# Patient Record
Sex: Male | Born: 1985 | Race: White | Hispanic: No | Marital: Married | Smoking: Current every day smoker
Health system: Southern US, Community
[De-identification: ages and names within clinical notes are randomized; demographics above are authoritative.]

## PROBLEM LIST (undated history)

## (undated) DIAGNOSIS — M549 Dorsalgia, unspecified: Secondary | ICD-10-CM

## (undated) DIAGNOSIS — F419 Anxiety disorder, unspecified: Secondary | ICD-10-CM

---

## 2007-09-20 ENCOUNTER — Emergency Department (HOSPITAL_COMMUNITY): Admission: EM | Admit: 2007-09-20 | Discharge: 2007-09-20 | Payer: Self-pay | Admitting: Emergency Medicine

## 2007-12-19 ENCOUNTER — Emergency Department (HOSPITAL_COMMUNITY): Admission: EM | Admit: 2007-12-19 | Discharge: 2007-12-19 | Payer: Self-pay | Admitting: *Deleted

## 2009-10-26 ENCOUNTER — Emergency Department (HOSPITAL_COMMUNITY): Admission: EM | Admit: 2009-10-26 | Discharge: 2009-10-26 | Payer: Self-pay | Admitting: Emergency Medicine

## 2009-11-03 ENCOUNTER — Emergency Department (HOSPITAL_COMMUNITY): Admission: EM | Admit: 2009-11-03 | Discharge: 2009-11-04 | Payer: Self-pay | Admitting: Emergency Medicine

## 2011-02-15 LAB — POCT I-STAT, CHEM 8
Chloride: 104
Creatinine, Ser: 1.1
Potassium: 3.4 — ABNORMAL LOW
TCO2: 24

## 2011-12-07 ENCOUNTER — Emergency Department (HOSPITAL_COMMUNITY): Payer: 59

## 2011-12-07 ENCOUNTER — Encounter (HOSPITAL_COMMUNITY): Payer: Self-pay | Admitting: *Deleted

## 2011-12-07 ENCOUNTER — Emergency Department (HOSPITAL_COMMUNITY)
Admission: EM | Admit: 2011-12-07 | Discharge: 2011-12-07 | Disposition: A | Discharge status: Home / Self Care | Payer: 59 | Attending: Emergency Medicine | Admitting: Emergency Medicine

## 2011-12-07 DIAGNOSIS — M549 Dorsalgia, unspecified: Secondary | ICD-10-CM

## 2011-12-07 DIAGNOSIS — F17200 Nicotine dependence, unspecified, uncomplicated: Secondary | ICD-10-CM | POA: Insufficient documentation

## 2011-12-07 DIAGNOSIS — M79609 Pain in unspecified limb: Secondary | ICD-10-CM | POA: Insufficient documentation

## 2011-12-07 HISTORY — DX: Dorsalgia, unspecified: M54.9

## 2011-12-07 MED ORDER — HYDROCODONE-ACETAMINOPHEN 5-325 MG PO TABS: ORAL_TABLET | ORAL | Status: AC

## 2011-12-07 MED ORDER — CYCLOBENZAPRINE HCL 10 MG PO TABS: 10.0000 mg | ORAL_TABLET | Freq: Three times a day (TID) | ORAL | Status: AC | PRN

## 2011-12-07 MED ORDER — PREDNISONE 10 MG PO TABS: ORAL_TABLET | ORAL | Status: DC
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
## 2011-12-07 NOTE — ED Notes (Signed)
Received report agree with previous assessment 
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
## 2011-12-07 NOTE — ED Notes (Signed)
Pt stable at discharge Pt instructed not to drive while on pain medication and muscle relaxers Verbalizes understanding

## 2011-12-16 NOTE — ED Provider Notes (Signed)
Medical screening examination/treatment/procedure(s) were performed by non-physician practitioner and as supervising physician I was immediately available for consultation/collaboration.  Donnetta Hutching, MD 12/16/11 [phone]

## 2012-02-08 ENCOUNTER — Emergency Department (HOSPITAL_COMMUNITY): Payer: 59

## 2012-02-08 ENCOUNTER — Emergency Department (HOSPITAL_COMMUNITY)
Admission: EM | Admit: 2012-02-08 | Discharge: 2012-02-08 | Disposition: A | Discharge status: Home / Self Care | Payer: 59 | Attending: Emergency Medicine | Admitting: Emergency Medicine

## 2012-02-08 ENCOUNTER — Encounter (HOSPITAL_COMMUNITY): Payer: Self-pay | Admitting: *Deleted

## 2012-02-08 DIAGNOSIS — S335XXA Sprain of ligaments of lumbar spine, initial encounter: Secondary | ICD-10-CM | POA: Insufficient documentation

## 2012-02-08 DIAGNOSIS — S39012A Strain of muscle, fascia and tendon of lower back, initial encounter: Secondary | ICD-10-CM

## 2012-02-08 DIAGNOSIS — X500XXA Overexertion from strenuous movement or load, initial encounter: Secondary | ICD-10-CM | POA: Insufficient documentation

## 2012-02-08 DIAGNOSIS — Y92009 Unspecified place in unspecified non-institutional (private) residence as the place of occurrence of the external cause: Secondary | ICD-10-CM | POA: Insufficient documentation

## 2012-02-08 MED ORDER — HYDROCODONE-ACETAMINOPHEN 5-325 MG PO TABS: 1.0000 | ORAL_TABLET | Freq: Four times a day (QID) | ORAL | Status: AC | PRN

## 2012-02-08 MED ORDER — PREDNISONE 20 MG PO TABS
60.0000 mg | ORAL_TABLET | Freq: Once | ORAL | Status: AC
Start: 2012-02-08 — End: 2012-02-08
  Administered 2012-02-08: 60 mg via ORAL
  Filled 2012-02-08: qty 3

## 2012-02-08 MED ORDER — HYDROCODONE-ACETAMINOPHEN 5-325 MG PO TABS
1.0000 | ORAL_TABLET | Freq: Once | ORAL | Status: AC
  Administered 2012-02-08: 1 via ORAL
  Filled 2012-02-08: qty 1

## 2012-02-08 MED ORDER — PREDNISONE 50 MG PO TABS: 50.0000 mg | ORAL_TABLET | Freq: Every day | ORAL | Status: DC
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
## 2012-02-08 NOTE — ED Notes (Signed)
Pt has chronic lower back pain, picked up heavy jack yesterday and pain has increased today.
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
## 2012-02-09 NOTE — ED Provider Notes (Signed)
Medical screening examination/treatment/procedure(s) were performed by non-physician practitioner and as supervising physician I was immediately available for consultation/collaboration.  Zakya Halabi, MD 02/09/12 1644 

## 2012-06-13 ENCOUNTER — Emergency Department (HOSPITAL_COMMUNITY): Payer: 59

## 2012-06-13 ENCOUNTER — Encounter (HOSPITAL_COMMUNITY): Payer: Self-pay | Admitting: Emergency Medicine

## 2012-06-13 ENCOUNTER — Other Ambulatory Visit: Payer: Self-pay

## 2012-06-13 ENCOUNTER — Emergency Department (HOSPITAL_COMMUNITY)
Admission: EM | Admit: 2012-06-13 | Discharge: 2012-06-13 | Disposition: A | Discharge status: Home / Self Care | Payer: 59 | Attending: Emergency Medicine | Admitting: Emergency Medicine

## 2012-06-13 DIAGNOSIS — F17200 Nicotine dependence, unspecified, uncomplicated: Secondary | ICD-10-CM | POA: Insufficient documentation

## 2012-06-13 DIAGNOSIS — T1490XA Injury, unspecified, initial encounter: Secondary | ICD-10-CM

## 2012-06-13 DIAGNOSIS — S20219A Contusion of unspecified front wall of thorax, initial encounter: Secondary | ICD-10-CM

## 2012-06-13 MED ORDER — OXYCODONE-ACETAMINOPHEN 5-325 MG PO TABS
1.0000 | ORAL_TABLET | Freq: Once | ORAL | Status: AC
  Administered 2012-06-13: 1 via ORAL
  Filled 2012-06-13: qty 1

## 2012-06-13 MED ORDER — NAPROXEN 500 MG PO TABS: 500.0000 mg | ORAL_TABLET | Freq: Two times a day (BID) | ORAL | Status: DC

## 2012-06-13 MED ORDER — HYDROCODONE-ACETAMINOPHEN 5-325 MG PO TABS: ORAL_TABLET | ORAL | Status: DC

## 2012-06-13 MED ORDER — METHOCARBAMOL 500 MG PO TABS: ORAL_TABLET | ORAL | Status: DC
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
## 2012-07-02 NOTE — ED Provider Notes (Signed)
Medical screening examination/treatment/procedure(s) were performed by non-physician practitioner and as supervising physician I was immediately available for consultation/collaboration.  Donnetta Hutching, MD 07/02/12 [phone]

## 2013-06-06 ENCOUNTER — Emergency Department (HOSPITAL_COMMUNITY)
Admission: EM | Admit: 2013-06-06 | Discharge: 2013-06-06 | Disposition: A | Discharge status: Home / Self Care | Payer: 59 | Attending: Emergency Medicine | Admitting: Emergency Medicine

## 2013-06-06 ENCOUNTER — Encounter (HOSPITAL_COMMUNITY): Payer: Self-pay | Admitting: Emergency Medicine

## 2013-06-06 DIAGNOSIS — F419 Anxiety disorder, unspecified: Secondary | ICD-10-CM

## 2013-06-06 DIAGNOSIS — F17200 Nicotine dependence, unspecified, uncomplicated: Secondary | ICD-10-CM | POA: Insufficient documentation

## 2013-06-06 DIAGNOSIS — G479 Sleep disorder, unspecified: Secondary | ICD-10-CM | POA: Insufficient documentation

## 2013-06-06 DIAGNOSIS — R450 Nervousness: Secondary | ICD-10-CM | POA: Insufficient documentation

## 2013-06-06 DIAGNOSIS — M549 Dorsalgia, unspecified: Secondary | ICD-10-CM | POA: Insufficient documentation

## 2013-06-06 DIAGNOSIS — F411 Generalized anxiety disorder: Secondary | ICD-10-CM | POA: Insufficient documentation

## 2013-06-06 HISTORY — DX: Anxiety disorder, unspecified: F41.9

## 2013-06-06 MED ORDER — LORAZEPAM 1 MG PO TABS: 1.0000 mg | ORAL_TABLET | Freq: Three times a day (TID) | ORAL | Status: DC | PRN

## 2013-06-06 MED ORDER — LORAZEPAM 1 MG PO TABS
2.0000 mg | ORAL_TABLET | Freq: Once | ORAL | Status: AC
  Administered 2013-06-06: 2 mg via ORAL
  Filled 2013-06-06: qty 2
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
## 2013-06-06 NOTE — ED Notes (Signed)
Pt has hx of panic attacks and anxiety; after separation with spouse pt has had difficulty sleeping, eating and has been "jittery" and unnerved since last week.  Pt recently obtained insurance; however, hasn't made an appointment to see a provider yet.  He is actively seeking help/provider as well as he feels he needs assistance today.

## 2013-06-06 NOTE — ED Notes (Signed)
Pt appears less anxious and states that he feels a lot better.

## 2013-06-06 NOTE — Discharge Instructions (Signed)
Panic Attacks Panic attacks are sudden, short feelings of great fear or discomfort. You may have them for no reason when you are relaxed, when you are uneasy (anxious), or when you are sleeping.  HOME CARE  Take all your medicines as told.  Check with your doctor before starting new medicines.  Keep all doctor visits. GET HELP IF:  You are not able to take your medicines as told.  Your symptoms do not get better.  Your symptoms get worse. GET HELP RIGHT AWAY IF:  Your attacks seem different than your normal attacks.  You have thoughts about hurting yourself or others.  You take panic attack medicine and you have a side effect. MAKE SURE YOU:  Understand these instructions.  Will watch your condition.  Will get help right away if you are not doing well or get worse. Document Released: 06/11/2010 Document Revised: 02/27/2013 Document Reviewed: 12/21/2012 [facility] Patient Information 2014 [place], [place].  Use resource guide below to help you find the behavioral health followup and to help you find a primary care Dr. Return for any new or worse symptoms.   Emergency Department Resource Guide 1) Find a Doctor and Pay Out of Pocket Although you won't have to find out who is covered by your insurance plan, it is a good idea to ask around and get recommendations. You will then need to call the office and see if the doctor you have chosen will accept you as a new patient and what types of options they offer for patients who are self-pay. Some doctors offer discounts or will set up payment plans for their patients who do not have insurance, but you will need to ask so you aren't surprised when you get to your appointment.  2) Contact Your Local Health Department Not all health departments have doctors that can see patients for sick visits, but many do, so it is worth a call to see if yours does. If you don't know where your local health department is, you can check in your phone book.  The CDC also has a tool to help you locate your state's health department, and many state websites also have listings of all of their local health departments.  3) Find a Walk-in Clinic If your illness is not likely to be very severe or complicated, you may want to try a walk in clinic. These are popping up all over the country in pharmacies, drugstores, and shopping centers. They're usually staffed by nurse practitioners or physician assistants that have been trained to treat common illnesses and complaints. They're usually fairly quick and inexpensive. However, if you have serious medical issues or chronic medical problems, these are probably not your best option.  No Primary Care Doctor: - Call Health Connect at  [phone] - they can help you locate a primary care doctor that  accepts your insurance, provides certain services, etc. - Physician Referral Service- [phone]  Chronic Pain Problems: Organization         Address  Phone   Notes  [facility]  [phone] Patients need to be referred by their primary care doctor.   Medication Assistance: Organization         Address  Phone   Notes  [facility] Medication [facility] [address] [place], [place] [address] [phone] --Must be a resident of [facility] -- Must have NO insurance coverage whatsoever (no Medicaid/ Medicare, etc.) -- The pt. MUST have a primary care doctor  that directs their care regularly and follows them in the community   [facility]  [phone]   [employer]  [phone]    Agencies that provide inexpensive medical care: Organization         Address  Phone   Notes  [facility]  [phone]   [facility]    [phone]   [facility] [address] [place], [place] [address] [phone]   [facility] [address] [place] [place] [phone]   Planned Parenthood     [phone]   [facility]    [phone]   [facility]  [address], [place] Phone:  [phone], Fax:  [phone] Hours of Operation:  9 am - 6 pm, M-F.  Also accepts Medicaid/Medicare and self-pay.  [facility]  [address], [place] Phone: [phone], Fax: [phone]. Hours of Operation:  8:30 am - 5:30 pm, M-F.  Also accepts Medicaid and self-pay.  [facility] [place] [address], [place] Phone: [phone]   [facility] [address] Natasha Bence [place], [place] [phone], Ext. [phone] Mondays & Thursdays: 7-9 AM.  First 15 patients are seen on a first come, first serve basis.    Medicaid-accepting [facility] Providers:  Organization         Address  Phone   Notes  [facility] [address], [place] [phone] Also accepts self-pay patients.  [facility] [address] Laurell Josephs [place], [place]  [phone]   [facility] [address], [place] [phone]   [facility] [address], [place] [phone]   Renaye Rakers [address], [place]   [phone] Only accepts [place] patients after they have their name applied to their card.   Self-Pay (no insurance) in [facility]:  Organization         Address  Phone   Notes  Sickle Cell Patients, [facility] [address] [place], [place] [phone]   [facility] [address] [place], [place] [phone]   [facility] [place]  [address], [place] [phone]   [facility]/Dr. Osei-Bonsu  [address], [place] or [address], [place] [phone] Phone number for both [place] and [place] locations is the same.  Urgent Medical and  [facility] [address], [place] [phone]   [facility] [address] [place] or [address] [phone]   [facility] [address] [place] [phone], phone; [phone], fax Sees patients 1st and 3rd Saturday of every month.  Must not qualify for public or private insurance (i.e. Medicaid, Medicare, [place] Health Choice, Veterans' Benefits)  Household income should be no more than 200% of the poverty level The clinic cannot treat you if you are pregnant or think you are pregnant  Sexually transmitted diseases are not treated at the clinic.    Dental Care: Organization         Address  Phone  Notes  [facility] Department of [facility] [address] [place], [place] [phone] Accepts children  up to age 40 who are enrolled in Medicaid or [place] Health Choice; pregnant women with a Medicaid card; and children who have applied for Medicaid or [place] Health Choice, but were declined, whose parents can pay a reduced fee at time of service.  [facility] Department of [facility]  [address], [place] [phone] Accepts children up to age 74 who are enrolled in [place] or [place] Health Choice; pregnant women with a Medicaid card; and children who have applied for Medicaid or [place] Health Choice, but were declined, whose parents can pay a reduced fee at time of service.  [facility]  [address] [place], [place] [phone] Patients are seen by appointment only. Walk-ins are not accepted. [facility] will see patients 23 years of age and older. Monday - Tuesday (8am-5pm) Most Wednesdays (8:30-5pm) $30 per visit, cash only  [facility]  [address], [facility] [phone] Patients are seen by appointment only. Walk-ins are not accepted. [facility] will see patients 5 years of age and  older. One Wednesday Evening (Monthly: Volunteer Based).  $30 per visit, cash only  [employer]  [phone] for adults; Children under age 7, call Graduate Pediatric Dentistry at [phone]. Children aged 77-14, please call [phone] to request a pediatric application.  Dental services are provided in all areas of dental care including fillings, crowns and bridges, complete and partial dentures, implants, gum treatment, root canals, and extractions. Preventive care is also provided. Treatment is provided to both adults and children. Patients are selected via a lottery and there is often a waiting list.   [facility] [address], [place]  [phone] [email or web address]   [facility] [address] [place], [place] [phone], Ext. [phone] Second and Fourth Thursday of each month, opens at 6:30 AM; Clinic ends at 9 AM.  Patients are seen on a first-come first-served basis, and a limited number are seen during each clinic.   [facility]  [address] Ether Griffins [place], [place] [phone]   Eligibility Requirements You must have lived in [place], [place], or [place] counties for at least the last three months.   You cannot be eligible for state or federal sponsored [place], including [employer], [place], or [employer].   You generally cannot be eligible for healthcare insurance through your employer.    How to apply: Eligibility screenings are held every Tuesday and Wednesday afternoon from 1:00 pm until 4:00 pm. You do not need an appointment for the interview!  [facility] [address], [place], [place] [phone]   [facility] Health Department  [phone]   [facility]  [phone]   [facility] Health Department  [phone]    Behavioral Health Resources in the Community: Intensive Outpatient Programs Organization          Address  Phone  Notes  [facility] [address] [place], [place] [phone]   [facility] [address], [place], [place] [phone]   [facility] [address] [place], [place]  [phone]   [facility] [address],  [place], [place] [phone] or [phone]   Substance Abuse Resources Organization         Address  Phone  Notes  [facility]  2311199192   Addiction  [facility]  [phone]   [facility]  [phone]   [facility]  [phone]   Residential & Outpatient Substance Abuse Program  [phone]   Psychological Services Organization         Address  Phone  Notes  [facility]  [phone]- [phone]   [facility]  [phone]- [phone]   [facility] [address], [place] [phone] or [phone]    Mobile Crisis Teams Organization         Address  Phone  Notes  [facility], Mobile Crisis Care Unit  [phone]   [facility]  [address] [place], [place] [phone]   Doristine LocksSharon DeEsch [address] [place] [phone]    Self-Help/Support Groups Organization         Address  Phone             Notes  [facility] [place] - variety of support groups  336- [phone] Call for more information  Narcotics Anonymous (NA), [facility] [address], [place]  2 meetings at this location   Statisticianesidential Treatment Programs Organization         Address  Phone  Notes  [facility] [address],    [place]  [phone]   [facility]  [address], [place] [phone], [place], [place] [phone]   [facility] [address], [place] [phone] Admissions: 8am-3pm M-F  [facility] [address]    [place], [place] [phone]   [facility] [address], [place], [place] [phone]   [facility] [address]  [place], [place] [phone]   [facility] - Intensive Outpatient [address], [place], [place] [phone]   [facility] ([facility] [address]  [place], [place] [phone] or [phone]   [facility] ([facility]) [address] [place], [place] [phone] Accepts Medicaid  [facility] [address]  [place] [phone] Substance Abuse/Addiction Treatment   [facility] Resources Organization         Address  Phone  Notes  [facility]  [phone]   Angie FavaJulie Brannon, PhD [address] [place], [place]   [phone]) [phone] or [phone]   [facility]   [address] [place], [place] [phone](336) [phone]   [facility] [address], [place], [place] [phone]) [phone] Insurance/Medicaid/sponsorship through [facility]  [facility] [address]                                    [place], [place] [phone] Therapy/tele-psych/case  [facility] [address]   [place], [place] [phone]    Dr. Lolly MustacheArfeen  [phone]   [facility] [place]  [employer] [facility] 1) [address], [place] 2) [address], [place] 3)  [address], [place] [phone](336) [phone]  [phone]   [facility] Child Abuse Hotline [phone](336) [phone] or [phone](336) [phone] (After Hours)

## 2013-06-06 NOTE — ED Notes (Signed)
Pt reports having hx of anxiety, does not take meds for it. Is going through a divorce and high stress level, increase in anxiety. Denies any SI or HI.

## 2013-06-22 ENCOUNTER — Encounter (HOSPITAL_COMMUNITY): Payer: Self-pay | Admitting: Emergency Medicine

## 2013-06-22 ENCOUNTER — Emergency Department (HOSPITAL_COMMUNITY)
Admission: EM | Admit: 2013-06-22 | Discharge: 2013-06-22 | Disposition: A | Discharge status: Home / Self Care | Payer: 59 | Attending: Emergency Medicine | Admitting: Emergency Medicine

## 2013-06-22 DIAGNOSIS — Z79899 Other long term (current) drug therapy: Secondary | ICD-10-CM | POA: Insufficient documentation

## 2013-06-22 DIAGNOSIS — F17200 Nicotine dependence, unspecified, uncomplicated: Secondary | ICD-10-CM | POA: Insufficient documentation

## 2013-06-22 DIAGNOSIS — F1010 Alcohol abuse, uncomplicated: Secondary | ICD-10-CM | POA: Insufficient documentation

## 2013-06-22 DIAGNOSIS — F411 Generalized anxiety disorder: Secondary | ICD-10-CM | POA: Insufficient documentation

## 2013-06-22 DIAGNOSIS — Z789 Other specified health status: Secondary | ICD-10-CM

## 2013-06-22 LAB — COMPREHENSIVE METABOLIC PANEL
ALBUMIN: 4.9 g/dL (ref 3.5–5.2)
ALK PHOS: 83 U/L (ref 39–117)
ALT: 32 U/L (ref 0–53)
AST: 34 U/L (ref 0–37)
BILIRUBIN TOTAL: 0.6 mg/dL (ref 0.3–1.2)
BUN: 8 mg/dL (ref 6–23)
CO2: 29 meq/L (ref 19–32)
Calcium: 9.5 mg/dL (ref 8.4–10.5)
Chloride: 100 mEq/L (ref 96–112)
Creatinine, Ser: 0.94 mg/dL (ref 0.50–1.35)
Glucose, Bld: 109 mg/dL — ABNORMAL HIGH (ref 70–99)
POTASSIUM: 4.6 meq/L (ref 3.7–5.3)
Sodium: 144 mEq/L (ref 137–147)
Total Protein: 8.5 g/dL — ABNORMAL HIGH (ref 6.0–8.3)

## 2013-06-22 LAB — CBC
HCT: 50.5 % (ref 39.0–52.0)
Hemoglobin: 17.3 g/dL — ABNORMAL HIGH (ref 13.0–17.0)
MCH: 29.1 pg (ref 26.0–34.0)
MCHC: 34.3 g/dL (ref 30.0–36.0)
MCV: 84.9 fL (ref 78.0–100.0)
Platelets: 332 10*3/uL (ref 150–400)
RBC: 5.95 MIL/uL — ABNORMAL HIGH (ref 4.22–5.81)
RDW: 11.9 % (ref 11.5–15.5)
WBC: 14.5 10*3/uL — ABNORMAL HIGH (ref 4.0–10.5)

## 2013-06-22 LAB — SALICYLATE LEVEL: Salicylate Lvl: <2 mg/dL — ABNORMAL LOW (ref 2.8–20.0)

## 2013-06-22 LAB — ACETAMINOPHEN LEVEL

## 2013-06-22 LAB — ETHANOL: ALCOHOL ETHYL (B): 240 mg/dL — AB (ref 0–11)

## 2013-06-22 NOTE — Discharge Instructions (Signed)
Alcohol and Nutrition °Nutrition serves two purposes. It provides energy. It also maintains body structure and function. Food supplies energy. It also provides the building blocks needed to replace worn or damaged cells. Alcoholics often eat poorly. This limits their supply of essential nutrients. This affects energy supply and structure maintenance. Alcohol also affects the body's nutrients in: °· Digestion. °· Storage. °· Using and getting rid of waste products. °IMPAIRMENT OF NUTRIENT DIGESTION AND UTILIZATION  °· Once ingested, food must be broken down into small components (digested). Then it is available for energy. It helps maintain body structure and function. Digestion begins in the mouth. It continues in the stomach and intestines, with help from the pancreas. The nutrients from digested food are absorbed from the intestines into the blood. Then they are carried to the liver. The liver prepares nutrients for: °· Immediate use. °· Storage and future use. °· Alcohol inhibits the breakdown of nutrients into usable molecules. °· It decreases secretion of digestive enzymes from the pancreas. °· Alcohol impairs nutrient absorption by damaging the cells lining the stomach and intestines. °· It also interferes with moving some nutrients into the blood. °· In addition, nutritional deficiencies themselves may lead to further absorption problems. °· For example, folate deficiency changes the cells that line the small intestine. This impairs how water is absorbed. It also affects absorbed nutrients. These include glucose, sodium, and additional folate. °· Even if nutrients are digested and absorbed, alcohol can prevent them from being fully used. It changes their transport, storage, and excretion. Impaired utilization of nutrients by alcoholics is indicated by: °· Decreased liver stores of vitamins, such as vitamin A. °· Increased excretion of nutrients such as fat. °ALCOHOL AND ENERGY SUPPLY  °· Three basic  nutritional components found in food are: °· Carbohydrates. °· Proteins. °· Fats. °· These are used as energy. Some alcoholics take in as much as 50% of their total daily calories from alcohol. They often neglect important foods. °· Even when enough food is eaten, alcohol can impair the ways the body controls blood sugar (glucose) levels. It may either increase or decrease blood sugar. °· In non-diabetic alcoholics, increased blood sugar (hyperglycemia) is caused by poor insulin secretion. It is usually temporary. °· Decreased blood sugar (hypoglycemia) can cause serious injury even if this condition is short-lived. Low blood sugar can happen when a fasting or malnourished person drinks alcohol. When there is no food to supply energy, stored sugar is used up. The products of alcohol inhibit forming glucose from other compounds such as amino acids. As a result, alcohol causes the brain and other body tissue to lack glucose. It is needed for energy and function. °· Alcohol is an energy source. But how the body processes and uses the energy from alcohol is complex. Also, when alcohol is substituted for carbohydrates, subjects tend to lose weight. This indicates that they get less energy from alcohol than from food. °ALCOHOL - MAINTAINING CELL STRUCTURE AND FUNCTION  °Structure °Cells are made mostly of protein. So an adequate protein diet is important for maintaining cell structure. This is especially true if cells are being damaged. Research indicates that alcohol affects protein nutrition by causing impaired: °· Digestion of proteins to amino acids. °· Processing of amino acids by the small intestine and liver. °· Synthesis of proteins from amino acids. °· Protein secretion by the liver. °Function °Nutrients are essential for the body to function well. They provide the tools that the body needs to work well:  °·   Proteins. °· Vitamins. °· Minerals. °Alcohol can disrupt body function. It may cause nutrient  deficiencies. And it may interfere with the way nutrients are processed. °Vitamins °· Vitamins are essential to maintain growth and normal metabolism. They regulate many of the body`s processes. Chronic heavy drinking causes deficiencies in many vitamins. This is caused by eating less. And, in some cases, vitamins may be poorly absorbed. For example, alcohol inhibits fat absorption. It impairs how the vitamins A, E, and D are normally absorbed along with dietary fats. Not enough vitamin A may cause night blindness. Not enough vitamin D may cause softening of the bones. °· Some alcoholics lack vitamins A, C, D, E, K, and the B vitamins. These are all involved in wound healing and cell maintenance. In particular, because vitamin K is necessary for blood clotting, lacking that vitamin can cause delayed clotting. The result is excess bleeding. Lacking other vitamins involved in brain function may cause severe neurological damage. °Minerals °Deficiencies of minerals such as calcium, magnesium, iron, and zinc are common in alcoholics. The alcohol itself does not seem to affect how these minerals are absorbed. Rather, they seem to occur secondary to other alcohol-related problems, such as: °· Less calcium absorbed. °· Not enough magnesium. °· More urinary excretion. °· Vomiting. °· Diarrhea. °· Not enough iron due to gastrointestinal bleeding. °· Not enough zinc or losses related to other nutrient deficiencies. °· Mineral deficiencies can cause a variety of medical consequences. These range from calcium-related bone disease to zinc-related night blindness and skin lesions. °ALCOHOL, MALNUTRITION, AND MEDICAL COMPLICATIONS  °Liver Disease  °· Alcoholic liver damage is caused primarily by alcohol itself. But poor nutrition may increase the risk of alcohol-related liver damage. For example, nutrients normally found in the liver are known to be affected by drinking alcohol. These include carotenoids, which are the major  sources of vitamin A, and vitamin E compounds. Decreases in such nutrients may play some role in alcohol-related liver damage. °Pancreatitis °· Research suggests that malnutrition may increase the risk of developing alcoholic pancreatitis. Research suggests that a diet lacking in protein may increase alcohol's damaging effect on the pancreas. °Brain °· Nutritional deficiencies may have severe effects on brain function. These may be permanent. Specifically, thiamine deficiencies are often seen in alcoholics. They can cause severe neurological problems. These include: °· Impaired movement. °· Memory loss seen in Wernicke-Korsakoff syndrome. °Pregnancy °· Alcohol has toxic effects on fetal development. It causes alcohol-related birth defects. They include fetal alcohol syndrome. Alcohol itself is toxic to the fetus. Also, the nutritional deficiency can affect how the fetus develops. That may compound the risk of developmental damage. °· Nutritional needs during pregnancy are 10% to 30% greater than normal. Food intake can increase by as much as 140% to cover the needs of both mother and fetus. An alcoholic mother`s nutritional problems may adversely affect the nutrition of the fetus. And alcohol itself can also restrict nutrition flow to the fetus. °NUTRITIONAL STATUS OF ALCOHOLICS  °Techniques for assessing nutritional status include: °· Taking body measurements to estimate fat reserves. They include: °· Weight. °· Height. °· Mass. °· Skin fold thickness. °· Performing blood analysis to provide measurements of circulating: °· Proteins. °· Vitamins. °· Minerals. °· These techniques tend to be imprecise. For many nutrients, there is no clear "cut-off" point that would allow an accurate definition of deficiency. So assessing the nutritional status of alcoholics is limited by these techniques. Dietary status may provide information about the risk of developing nutritional problems.   Dietary status is assessed by: °· Taking  patients' dietary histories. °· Evaluating the amount and types of food they are eating. °· It is difficult to determine what exact amount of alcohol begins to have damaging effects on nutrition. In general, moderate drinkers have 2 drinks or less per day. They seem to be at little risk for nutritional problems. Various medical disorders begin to appear at greater levels. °· Research indicates that the majority of even the heaviest drinkers have few obvious nutritional deficiencies. Many alcoholics who are hospitalized for medical complications of their disease do have severe malnutrition. Alcoholics tend to eat poorly. Often they eat less than the amounts of food necessary to provide enough: °· Carbohydrates. °· Protein. °· Fat. °· Vitamins A and C. °· B vitamins. °· Minerals like calcium and iron. °Of major concern is alcohol's effect on digesting food and use of nutrients. It may shift a mildly malnourished person toward severe malnutrition. °Document Released: 03/03/2005 Document Revised: 08/01/2011 Document Reviewed: 08/17/2005 °[employer]® Patient Information ©2014 [employer]. ° °

## 2013-06-22 NOTE — ED Provider Notes (Signed)
Medical screening examination/treatment/procedure(s) were performed by non-physician practitioner and as supervising physician I was immediately available for consultation/collaboration.   Zylpha Poynor, MD 06/22/13 0755 
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
## 2013-07-31 ENCOUNTER — Encounter: Payer: Self-pay | Admitting: Family Medicine

## 2013-07-31 DIAGNOSIS — F419 Anxiety disorder, unspecified: Secondary | ICD-10-CM | POA: Insufficient documentation

## 2013-08-08 ENCOUNTER — Ambulatory Visit (INDEPENDENT_AMBULATORY_CARE_PROVIDER_SITE_OTHER): Payer: 59 | Admitting: Physician Assistant

## 2013-08-08 ENCOUNTER — Encounter: Payer: Self-pay | Admitting: Physician Assistant

## 2013-08-08 VITALS — BP 132/78 | HR 84 | Temp 98.4°F | Resp 18 | Ht 71.25 in | Wt 140.0 lb

## 2013-08-08 DIAGNOSIS — F410 Panic disorder [episodic paroxysmal anxiety] without agoraphobia: Secondary | ICD-10-CM | POA: Insufficient documentation

## 2013-08-08 MED ORDER — FLUOXETINE HCL 20 MG PO TABS: 20.0000 mg | ORAL_TABLET | Freq: Every day | ORAL | Status: DC

## 2013-08-08 MED ORDER — LORAZEPAM 1 MG PO TABS: 1.0000 mg | ORAL_TABLET | Freq: Three times a day (TID) | ORAL | Status: DC | PRN
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
## 2013-08-19 ENCOUNTER — Other Ambulatory Visit: Payer: Self-pay | Admitting: Physician Assistant

## 2013-08-19 DIAGNOSIS — F410 Panic disorder [episodic paroxysmal anxiety] without agoraphobia: Secondary | ICD-10-CM

## 2013-08-19 NOTE — Telephone Encounter (Signed)
I just saw this patient as a new patient to our office on 08/08/13. At that visit I gave a prescription for Prozac 20 mg 1 daily #30 with one refill I also gave her prescription for the Ativan 1 mg --that he could take up to 3 times daily if needed and gave him 90 pills. Is to have followup office visit 6 weeks.  ??? Why is he requesting prescriptions now????

## 2013-08-19 NOTE — Telephone Encounter (Signed)
Call back number is [phone] [facility]  Pt is needing the following prescriptions sent to the pharmacy   LORazepam (ATIVAN) 1 MG tablet FLUoxetine (PROZAC) 20 MG tablet   ---please call pt to let him know once this has been sent over

## 2013-08-21 MED ORDER — LORAZEPAM 1 MG PO TABS: 1.0000 mg | ORAL_TABLET | Freq: Three times a day (TID) | ORAL | Status: DC | PRN

## 2013-08-21 NOTE — Telephone Encounter (Signed)
Pt did not need Prozac, that was escribed.  Pharmacy confirms that RX.  They never received faxed copy of Lorazepam RX.  Verbal order given today #90 NO refills.  Pt does have 6 week follow up appt on schedule.

## 2013-08-21 NOTE — Telephone Encounter (Signed)
Patient was given RX for Lorazepam at office visit.  Rx was not faxed.  [facility] states this has been filled somewhere per insurance.  Insurance says no refill should be due until 09/09/13.  Tried to call patient back to find out what he did with RX.  Rx called in was canceled.

## 2013-09-01 ENCOUNTER — Encounter (HOSPITAL_COMMUNITY): Payer: Self-pay | Admitting: Emergency Medicine

## 2013-09-01 ENCOUNTER — Emergency Department (HOSPITAL_COMMUNITY)
Admission: EM | Admit: 2013-09-01 | Discharge: 2013-09-01 | Disposition: A | Discharge status: Home / Self Care | Payer: 59 | Attending: Emergency Medicine | Admitting: Emergency Medicine

## 2013-09-01 DIAGNOSIS — Z79899 Other long term (current) drug therapy: Secondary | ICD-10-CM | POA: Insufficient documentation

## 2013-09-01 DIAGNOSIS — B86 Scabies: Secondary | ICD-10-CM | POA: Insufficient documentation

## 2013-09-01 DIAGNOSIS — F17200 Nicotine dependence, unspecified, uncomplicated: Secondary | ICD-10-CM | POA: Insufficient documentation

## 2013-09-01 DIAGNOSIS — F411 Generalized anxiety disorder: Secondary | ICD-10-CM | POA: Insufficient documentation

## 2013-09-01 MED ORDER — PREDNISONE 50 MG PO TABS
60.0000 mg | ORAL_TABLET | Freq: Once | ORAL | Status: AC
  Administered 2013-09-01: 60 mg via ORAL

## 2013-09-01 MED ORDER — PREDNISONE 10 MG PO TABS
ORAL_TABLET | ORAL | Status: AC
  Filled 2013-09-01: qty 1

## 2013-09-01 MED ORDER — PREDNISONE 50 MG PO TABS
ORAL_TABLET | ORAL | Status: AC
  Filled 2013-09-01: qty 1

## 2013-09-01 MED ORDER — PREDNISONE 10 MG PO TABS: 20.0000 mg | ORAL_TABLET | Freq: Every day | ORAL | Status: DC

## 2013-09-01 MED ORDER — PERMETHRIN 5 % EX CREA: TOPICAL_CREAM | CUTANEOUS | Status: DC

## 2013-09-01 MED ORDER — DIPHENHYDRAMINE HCL 25 MG PO TABS: 25.0000 mg | ORAL_TABLET | Freq: Four times a day (QID) | ORAL | Status: DC

## 2013-09-01 NOTE — Discharge Instructions (Signed)
Use the medication as directed. Follow up with your dermatologist. Return here as needed.

## 2013-09-01 NOTE — ED Notes (Signed)
Pt reports itching x3 days. Pt denies any fever,n/v. Pt has small generalizad bumps noted. Airway patent. nad noted.
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
## 2013-09-03 ENCOUNTER — Emergency Department (HOSPITAL_COMMUNITY)
Admission: EM | Admit: 2013-09-03 | Discharge: 2013-09-03 | Disposition: A | Discharge status: Home / Self Care | Payer: 59 | Attending: Emergency Medicine | Admitting: Emergency Medicine

## 2013-09-03 ENCOUNTER — Emergency Department (HOSPITAL_COMMUNITY): Payer: 59

## 2013-09-03 ENCOUNTER — Encounter (HOSPITAL_COMMUNITY): Payer: Self-pay | Admitting: Emergency Medicine

## 2013-09-03 DIAGNOSIS — R079 Chest pain, unspecified: Secondary | ICD-10-CM | POA: Insufficient documentation

## 2013-09-03 DIAGNOSIS — IMO0002 Reserved for concepts with insufficient information to code with codable children: Secondary | ICD-10-CM | POA: Insufficient documentation

## 2013-09-03 DIAGNOSIS — K709 Alcoholic liver disease, unspecified: Secondary | ICD-10-CM | POA: Insufficient documentation

## 2013-09-03 DIAGNOSIS — F411 Generalized anxiety disorder: Secondary | ICD-10-CM | POA: Insufficient documentation

## 2013-09-03 DIAGNOSIS — R5383 Other fatigue: Secondary | ICD-10-CM

## 2013-09-03 DIAGNOSIS — M549 Dorsalgia, unspecified: Secondary | ICD-10-CM | POA: Insufficient documentation

## 2013-09-03 DIAGNOSIS — Z79899 Other long term (current) drug therapy: Secondary | ICD-10-CM | POA: Insufficient documentation

## 2013-09-03 DIAGNOSIS — R5381 Other malaise: Secondary | ICD-10-CM | POA: Insufficient documentation

## 2013-09-03 DIAGNOSIS — Z87828 Personal history of other (healed) physical injury and trauma: Secondary | ICD-10-CM | POA: Insufficient documentation

## 2013-09-03 DIAGNOSIS — F1010 Alcohol abuse, uncomplicated: Secondary | ICD-10-CM | POA: Insufficient documentation

## 2013-09-03 DIAGNOSIS — F419 Anxiety disorder, unspecified: Secondary | ICD-10-CM

## 2013-09-03 DIAGNOSIS — F17200 Nicotine dependence, unspecified, uncomplicated: Secondary | ICD-10-CM | POA: Insufficient documentation

## 2013-09-03 DIAGNOSIS — B86 Scabies: Secondary | ICD-10-CM | POA: Insufficient documentation

## 2013-09-03 DIAGNOSIS — R569 Unspecified convulsions: Secondary | ICD-10-CM | POA: Insufficient documentation

## 2013-09-03 DIAGNOSIS — R000 Tachycardia, unspecified: Secondary | ICD-10-CM | POA: Insufficient documentation

## 2013-09-03 LAB — CBC WITH DIFFERENTIAL/PLATELET
BASOS ABS: 0 10*3/uL (ref 0.0–0.1)
Basophils Relative: 0 % (ref 0–1)
Eosinophils Absolute: 0.1 10*3/uL (ref 0.0–0.7)
Eosinophils Relative: 1 % (ref 0–5)
HEMATOCRIT: 48.6 % (ref 39.0–52.0)
Hemoglobin: 16.4 g/dL (ref 13.0–17.0)
Lymphocytes Relative: 7 % — ABNORMAL LOW (ref 12–46)
Lymphs Abs: 0.7 10*3/uL (ref 0.7–4.0)
MCH: 28.6 pg (ref 26.0–34.0)
MCHC: 33.7 g/dL (ref 30.0–36.0)
MCV: 84.8 fL (ref 78.0–100.0)
Monocytes Absolute: 0.6 10*3/uL (ref 0.1–1.0)
Monocytes Relative: 6 % (ref 3–12)
Neutro Abs: 8.1 10*3/uL — ABNORMAL HIGH (ref 1.7–7.7)
Neutrophils Relative %: 85 % — ABNORMAL HIGH (ref 43–77)
Platelets: 220 10*3/uL (ref 150–400)
RBC: 5.73 MIL/uL (ref 4.22–5.81)
RDW: 12.9 % (ref 11.5–15.5)
WBC: 9.5 10*3/uL (ref 4.0–10.5)

## 2013-09-03 LAB — BASIC METABOLIC PANEL
BUN: 8 mg/dL (ref 6–23)
CALCIUM: 9.7 mg/dL (ref 8.4–10.5)
CO2: 28 meq/L (ref 19–32)
Chloride: 98 mEq/L (ref 96–112)
Creatinine, Ser: 0.76 mg/dL (ref 0.50–1.35)
GFR calc Af Amer: >90 mL/min (ref >90)
GFR calc non Af Amer: >90 mL/min (ref >90)
Glucose, Bld: 86 mg/dL (ref 70–99)
Potassium: 3.8 mEq/L (ref 3.7–5.3)
Sodium: 141 mEq/L (ref 137–147)

## 2013-09-03 LAB — HEPATIC FUNCTION PANEL
ALT: 16 U/L (ref 0–53)
AST: 34 U/L (ref 0–37)
Albumin: 4.7 g/dL (ref 3.5–5.2)
Alkaline Phosphatase: 76 U/L (ref 39–117)
BILIRUBIN INDIRECT: 2.5 mg/dL — AB (ref 0.3–0.9)
Bilirubin, Direct: 0.5 mg/dL — ABNORMAL HIGH (ref 0.0–0.3)
TOTAL PROTEIN: 8.2 g/dL (ref 6.0–8.3)
Total Bilirubin: 3 mg/dL — ABNORMAL HIGH (ref 0.3–1.2)

## 2013-09-03 LAB — RAPID URINE DRUG SCREEN, HOSP PERFORMED
Amphetamines: NOT DETECTED
BARBITURATES: NOT DETECTED
BENZODIAZEPINES: NOT DETECTED
Cocaine: NOT DETECTED
Opiates: NOT DETECTED
TETRAHYDROCANNABINOL: POSITIVE — AB

## 2013-09-03 LAB — ETHANOL: Alcohol, Ethyl (B): <11 mg/dL (ref 0–11)

## 2013-09-03 LAB — TROPONIN I

## 2013-09-03 MED ORDER — SODIUM CHLORIDE 0.9 % IV SOLN: Freq: Once | INTRAVENOUS | Status: DC

## 2013-09-03 MED ORDER — PERMETHRIN 5 % EX CREA: TOPICAL_CREAM | CUTANEOUS | Status: DC

## 2013-09-03 MED ORDER — PREDNISONE 10 MG PO TABS: ORAL_TABLET | ORAL | Status: DC

## 2013-09-03 MED ORDER — LORAZEPAM 2 MG/ML IJ SOLN
2.0000 mg | Freq: Once | INTRAMUSCULAR | Status: AC
  Administered 2013-09-03: 2 mg via INTRAVENOUS
  Filled 2013-09-03: qty 1
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
## 2013-09-03 NOTE — ED Notes (Addendum)
Seizure pads placed prior to pt being transferred to xray.

## 2013-09-03 NOTE — Discharge Instructions (Signed)
Scabies Scabies are small bugs (mites) that burrow under the skin and cause red bumps and severe itching. These bugs can only be seen with a microscope. Scabies are highly contagious. They can spread easily from person to person by direct contact. They are also spread through sharing clothing or linens that have the scabies mites living in them. It is not unusual for an entire family to become infected through shared towels, clothing, or bedding.  HOME CARE INSTRUCTIONS   Your caregiver may prescribe a cream or lotion to kill the mites. If cream is prescribed, massage the cream into the entire body from the neck to the bottom of both feet. Also massage the cream into the scalp and face if your child is less than 27 year old. Avoid the eyes and mouth. Do not wash your hands after application.  Leave the cream on for 8 to 12 hours. Your child should bathe or shower after the 8 to 12 hour application period. Sometimes it is helpful to apply the cream to your child right before bedtime.  One treatment is usually effective and will eliminate approximately 95% of infestations. For severe cases, your caregiver may decide to repeat the treatment in 1 week. Everyone in your household should be treated with one application of the cream.  New rashes or burrows should not appear within 24 to 48 hours after successful treatment. However, the itching and rash may last for 2 to 4 weeks after successful treatment. Your caregiver may prescribe a medicine to help with the itching or to help the rash go away more quickly.  Scabies can live on clothing or linens for up to 3 days. All of your child's recently used clothing, towels, stuffed toys, and bed linens should be washed in hot water and then dried in a dryer for at least 20 minutes on high heat. Items that cannot be washed should be enclosed in a plastic bag for at least 3 days.  To help relieve itching, bathe your child in a cool bath or apply cool washcloths to the  affected areas.  Your child may return to school after treatment with the prescribed cream. SEEK MEDICAL CARE IF:   The itching persists longer than 4 weeks after treatment.  The rash spreads or becomes infected. Signs of infection include red blisters or yellow-tan crust. Document Released: 05/09/2005 Document Revised: 08/01/2011 Document Reviewed: 09/17/2008 [facility] Patient Information 2014 [place], [place].  Panic Attacks Panic attacks are sudden, short feelings of great fear or discomfort. You may have them for no reason when you are relaxed, when you are uneasy (anxious), or when you are sleeping.  HOME CARE  Take all your medicines as told.  Check with your doctor before starting new medicines.  Keep all doctor visits. GET HELP IF:  You are not able to take your medicines as told.  Your symptoms do not get better.  Your symptoms get worse. GET HELP RIGHT AWAY IF:  Your attacks seem different than your normal attacks.  You have thoughts about hurting yourself or others.  You take panic attack medicine and you have a side effect. MAKE SURE YOU:  Understand these instructions.  Will watch your condition.  Will get help right away if you are not doing well or get worse. Document Released: 06/11/2010 Document Revised: 02/27/2013 Document Reviewed: 12/21/2012 [facility] Patient Information 2014 [place], [place].
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
## 2013-09-03 NOTE — Discharge Instructions (Signed)
Seizure, Adult Take your Ativan as prescribed. Avoid alcohol together with Ativan as the combination can be dangerous. [facility] to help you with your drinking problem. Call Dr.[place] tomorrow schedule an appointment to be evaluated further for seizures. No driving allowed until okayed by your Doctor A seizure is abnormal electrical activity in the brain. Seizures usually last from 30 seconds to 2 minutes. There are various types of seizures. Before a seizure, you may have a warning sensation (aura) that a seizure is about to occur. An aura may include the following symptoms:   Fear or anxiety.  Nausea.  Feeling like the room is spinning (vertigo).  Vision changes, such as seeing flashing lights or spots. Common symptoms during a seizure include:  A change in attention or behavior (altered mental status).  Convulsions with rhythmic jerking movements.  Drooling.  Rapid eye movements.  Grunting.  Loss of bladder and bowel control.  Bitter taste in the mouth.  Tongue biting. After a seizure, you may feel confused and sleepy. You may also have an injury resulting from convulsions during the seizure. HOME CARE INSTRUCTIONS   If you are given medicines, take them exactly as prescribed by your health care provider.  Keep all follow-up appointments as directed by your health care provider.  Do not swim or drive or engage in risky activity during which a seizure could cause further injury to you or others until your health care provider says it is OK.  Get adequate rest.  Teach friends and family what to do if you have a seizure. They should:  Lay you on the ground to prevent a fall.  Put a cushion under your head.  Loosen any tight clothing around your neck.  Turn you on your side. If vomiting occurs, this helps keep your airway clear.  Stay with you until you recover.  Know whether or not you need emergency care. SEEK IMMEDIATE  MEDICAL CARE IF:  The seizure lasts longer than 5 minutes.  The seizure is severe or you do not wake up immediately after the seizure.  You have an altered mental status after the seizure.  You are having more frequent or worsening seizures. Someone should drive you to the emergency department or call local emergency services (911 in [place] MAKE SURE YOU:  Understand these instructions.  Will watch your condition.  Will get help right away if you are not doing well or get worse. Document Released: 05/06/2000 Document Revised: 02/27/2013 Document Reviewed: 12/19/2012 [facility] Patient Information 2014 [place], [place].
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
## 2013-09-03 NOTE — ED Provider Notes (Signed)
Results for orders placed during the hospital encounter of 09/03/13  HEPATIC FUNCTION PANEL      Result Value Ref Range   Total Protein 8.2  6.0 - 8.3 g/dL   Albumin 4.7  3.5 - 5.2 g/dL   AST 34  0 - 37 U/L   ALT 16  0 - 53 U/L   Alkaline Phosphatase 76  39 - 117 U/L   Total Bilirubin 3.0 (*) 0.3 - 1.2 mg/dL   Bilirubin, Direct 0.5 (*) 0.0 - 0.3 mg/dL   Indirect Bilirubin 2.5 (*) 0.3 - 0.9 mg/dL   Dg Chest 2 View  1/61/09604/14/2015   CLINICAL DATA:  chest pain  EXAM: CHEST  2 VIEW  COMPARISON:  DG RIBS UNILATERAL W/CHEST*L* dated 06/13/2012; DG THORACIC SPINE W/SWIMMERS dated 11/03/2009  FINDINGS: The heart size and mediastinal contours are within normal limits. Both lungs are clear. The visualized skeletal structures are unremarkable.  IMPRESSION: No active cardiopulmonary disease.   Electronically Signed   By: Salome HolmesHector  Cooper M.D.   On: 09/03/2013 14:55   Ct Head Wo Contrast  09/03/2013   CLINICAL DATA:  Seizure, weakness.  EXAM: CT HEAD WITHOUT CONTRAST  TECHNIQUE: Contiguous axial images were obtained from the base of the skull through the vertex without intravenous contrast.  COMPARISON:  CT HEAD W/O CM dated 09/20/2007  FINDINGS: No acute intracranial abnormality. Specifically, no hemorrhage, hydrocephalus, mass lesion, acute infarction, or significant intracranial injury. No acute calvarial abnormality. Visualized paranasal sinuses and mastoids clear. Orbital soft tissues unremarkable.  IMPRESSION: No acute intracranial abnormality.   Electronically Signed   By: Charlett NoseKevin  Dover M.D.   On: 09/03/2013 15:09   Chest xray viewed by me  Doug SouSam Krystale Rinkenberger, MD 09/03/13 [id number]

## 2013-09-03 NOTE — ED Notes (Addendum)
Pt was seen for same on Sunday and was diagnosed with scabies. Pt reports got prescription ointment filled but reports itching is not any better and reports generalized weakness.  Pt alert and oriented. Airway patent. Pt denies and n/v/fevers.Pt anxious in triage.

## 2013-09-04 ENCOUNTER — Encounter: Payer: Self-pay | Admitting: Physician Assistant

## 2013-09-04 ENCOUNTER — Ambulatory Visit (INDEPENDENT_AMBULATORY_CARE_PROVIDER_SITE_OTHER): Payer: 59 | Admitting: Physician Assistant

## 2013-09-04 VITALS — BP 116/76 | HR 80 | Temp 99.0°F | Resp 18 | Wt 135.0 lb

## 2013-09-04 DIAGNOSIS — F419 Anxiety disorder, unspecified: Secondary | ICD-10-CM

## 2013-09-04 DIAGNOSIS — F411 Generalized anxiety disorder: Secondary | ICD-10-CM

## 2013-09-04 DIAGNOSIS — F410 Panic disorder [episodic paroxysmal anxiety] without agoraphobia: Secondary | ICD-10-CM

## 2013-09-04 DIAGNOSIS — R569 Unspecified convulsions: Secondary | ICD-10-CM | POA: Insufficient documentation

## 2013-09-04 DIAGNOSIS — F1010 Alcohol abuse, uncomplicated: Secondary | ICD-10-CM | POA: Insufficient documentation

## 2013-09-04 NOTE — ED Provider Notes (Signed)
Medical screening examination/treatment/procedure(s) were performed by non-physician practitioner and as supervising physician I was immediately available for consultation/collaboration.   EKG Interpretation None        Khamille Beynon L Shaquoia Miers, MD 09/04/13 0714 
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
## 2013-09-09 NOTE — ED Provider Notes (Signed)
Medical screening examination/treatment/procedure(s) were conducted as a shared visit with non-physician practitioner(s) and myself.  I personally evaluated the patient during the encounter.   EKG Interpretation   Date/Time:  Tuesday September 03 2013 10:16:46 EDT Ventricular Rate:  77 PR Interval:  154 QRS Duration: 102 QT Interval:  364 QTC Calculation: 411 R Axis:   88 Text Interpretation:  Normal sinus rhythm Normal ECG When compared with  ECG of 13-Jun-2012 19:30, No significant change was found Confirmed by  Ryanna Teschner  MD, Bryssa Tones ([id number]) on 09/03/2013 10:27:44 AM     History and physical consistent with scabies. Rx permethrin cream and prednisone  Donnetta HutchingBrian Jakeya Gherardi, MD 09/09/13 [phone]

## 2013-09-19 ENCOUNTER — Ambulatory Visit: Payer: 59 | Admitting: Physician Assistant

## 2013-10-07 ENCOUNTER — Ambulatory Visit: Payer: 59 | Admitting: Physician Assistant

## 2013-10-10 ENCOUNTER — Ambulatory Visit: Payer: 59 | Admitting: Physician Assistant

## 2013-10-10 ENCOUNTER — Ambulatory Visit (INDEPENDENT_AMBULATORY_CARE_PROVIDER_SITE_OTHER): Payer: 59 | Admitting: Physician Assistant

## 2013-10-10 ENCOUNTER — Encounter: Payer: Self-pay | Admitting: Physician Assistant

## 2013-10-10 VITALS — BP 104/76 | HR 72 | Temp 98.2°F | Resp 18 | Wt 142.0 lb

## 2013-10-10 DIAGNOSIS — F17200 Nicotine dependence, unspecified, uncomplicated: Secondary | ICD-10-CM | POA: Insufficient documentation

## 2013-10-10 DIAGNOSIS — G4700 Insomnia, unspecified: Secondary | ICD-10-CM | POA: Insufficient documentation

## 2013-10-10 DIAGNOSIS — F1010 Alcohol abuse, uncomplicated: Secondary | ICD-10-CM

## 2013-10-10 DIAGNOSIS — F410 Panic disorder [episodic paroxysmal anxiety] without agoraphobia: Secondary | ICD-10-CM

## 2013-10-10 DIAGNOSIS — F419 Anxiety disorder, unspecified: Secondary | ICD-10-CM

## 2013-10-10 DIAGNOSIS — F411 Generalized anxiety disorder: Secondary | ICD-10-CM

## 2013-10-10 MED ORDER — LORAZEPAM 1 MG PO TABS: 1.0000 mg | ORAL_TABLET | Freq: Every day | ORAL | Status: DC

## 2013-10-10 MED ORDER — FLUOXETINE HCL 20 MG PO TABS: 20.0000 mg | ORAL_TABLET | Freq: Every day | ORAL | Status: DC
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
## 2013-11-01 ENCOUNTER — Encounter: Payer: Self-pay | Admitting: Family Medicine

## 2013-11-01 ENCOUNTER — Ambulatory Visit (INDEPENDENT_AMBULATORY_CARE_PROVIDER_SITE_OTHER): Payer: 59 | Admitting: Family Medicine

## 2013-11-01 VITALS — BP 130/78 | HR 76 | Temp 98.0°F | Resp 16 | Ht 72.5 in | Wt 142.0 lb

## 2013-11-01 DIAGNOSIS — F419 Anxiety disorder, unspecified: Secondary | ICD-10-CM

## 2013-11-01 DIAGNOSIS — R21 Rash and other nonspecific skin eruption: Secondary | ICD-10-CM

## 2013-11-01 DIAGNOSIS — N63 Unspecified lump in unspecified breast: Secondary | ICD-10-CM

## 2013-11-01 DIAGNOSIS — G4700 Insomnia, unspecified: Secondary | ICD-10-CM

## 2013-11-01 DIAGNOSIS — Z113 Encounter for screening for infections with a predominantly sexual mode of transmission: Secondary | ICD-10-CM

## 2013-11-01 DIAGNOSIS — F410 Panic disorder [episodic paroxysmal anxiety] without agoraphobia: Secondary | ICD-10-CM

## 2013-11-01 DIAGNOSIS — F411 Generalized anxiety disorder: Secondary | ICD-10-CM

## 2013-11-01 LAB — COMPREHENSIVE METABOLIC PANEL
ALT: 47 U/L (ref 0–53)
AST: 56 U/L — ABNORMAL HIGH (ref 0–37)
Albumin: 4.8 g/dL (ref 3.5–5.2)
Alkaline Phosphatase: 62 U/L (ref 39–117)
BUN: 11 mg/dL (ref 6–23)
CALCIUM: 9.4 mg/dL (ref 8.4–10.5)
CHLORIDE: 95 meq/L — AB (ref 96–112)
CO2: 30 meq/L (ref 19–32)
Creat: 0.82 mg/dL (ref 0.50–1.35)
GLUCOSE: 88 mg/dL (ref 70–99)
Potassium: 4.4 mEq/L (ref 3.5–5.3)
SODIUM: 138 meq/L (ref 135–145)
Total Bilirubin: 1.8 mg/dL — ABNORMAL HIGH (ref 0.2–1.2)
Total Protein: 7.5 g/dL (ref 6.0–8.3)

## 2013-11-01 LAB — CBC WITH DIFFERENTIAL/PLATELET
BASOS PCT: 1 % (ref 0–1)
Basophils Absolute: 0 10*3/uL (ref 0.0–0.1)
Eosinophils Absolute: 0.1 10*3/uL (ref 0.0–0.7)
Eosinophils Relative: 3 % (ref 0–5)
HEMATOCRIT: 42.3 % (ref 39.0–52.0)
HEMOGLOBIN: 14.6 g/dL (ref 13.0–17.0)
LYMPHS ABS: 1.3 10*3/uL (ref 0.7–4.0)
LYMPHS PCT: 37 % (ref 12–46)
MCH: 28.1 pg (ref 26.0–34.0)
MCHC: 34.5 g/dL (ref 30.0–36.0)
MCV: 81.3 fL (ref 78.0–100.0)
MONO ABS: 0.4 10*3/uL (ref 0.1–1.0)
MONOS PCT: 11 % (ref 3–12)
NEUTROS ABS: 1.6 10*3/uL — AB (ref 1.7–7.7)
Neutrophils Relative %: 48 % (ref 43–77)
Platelets: 201 10*3/uL (ref 150–400)
RBC: 5.2 MIL/uL (ref 4.22–5.81)
RDW: 13.2 % (ref 11.5–15.5)
WBC: 3.4 10*3/uL — ABNORMAL LOW (ref 4.0–10.5)

## 2013-11-01 LAB — RPR

## 2013-11-01 LAB — HIV ANTIBODY (ROUTINE TESTING W REFLEX): HIV: NONREACTIVE

## 2013-11-01 MED ORDER — METHYLPREDNISOLONE 4 MG PO KIT: PACK | ORAL | Status: DC

## 2013-11-01 MED ORDER — FLUOXETINE HCL 40 MG PO CAPS: 40.0000 mg | ORAL_CAPSULE | Freq: Every day | ORAL | Status: DC

## 2013-11-01 MED ORDER — HYDROXYZINE HCL 25 MG PO TABS: 25.0000 mg | ORAL_TABLET | Freq: Three times a day (TID) | ORAL | Status: DC | PRN

## 2013-11-01 NOTE — Assessment & Plan Note (Addendum)
His lesions are nonspecific they do not look like scabies and he's had this treatment. I will give her Medrol Dosepak he felt like he is itching all over from his head to his toes. I'll also have him start hydroxyzine as needed this will also help with his panic and anxiety disorder I would check his labs again we also discussed HIV testing and he wants to have this done I will check this with an RPR as well as HSV

## 2013-11-01 NOTE — Patient Instructions (Signed)
Increase prozac 40mg   We will call you with lab results Atarax for itching and panic  Medrol dose pak for rash Apply warm compress to breast bud F/U 4 weeks
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
## 2013-11-01 NOTE — Assessment & Plan Note (Signed)
Increase fluoxetine to 40 mg daily we'll see how he does with this we may need to switch him to an SSRI or add BuSpar for anxiety and panic Does not want to increase his lorazepam and discussed with him habit-forming she noticed medication Hydroxyzine will also be given

## 2013-11-01 NOTE — Assessment & Plan Note (Signed)
I do feel palpable breast tissue beneath both nipples the left side as well but more prominent it is nontender no sign of infection I will recheck this at his followup in 4 weeks she will be on some anti-inflammatories advised to use warm compresses to the area as it just came up this week. If there is any enlargement or changes we will get an ultrasound

## 2013-11-04 LAB — HSV(HERPES SIMPLEX VRS) I + II AB-IGG
HSV 1 GLYCOPROTEIN G AB, IGG: 12.05 IV — AB
HSV 2 Glycoprotein G Ab, IgG: 0.47 IV

## 2013-11-06 ENCOUNTER — Encounter: Payer: Self-pay | Admitting: *Deleted

## 2013-11-11 ENCOUNTER — Telehealth: Payer: Self-pay | Admitting: Physician Assistant

## 2013-11-11 DIAGNOSIS — F410 Panic disorder [episodic paroxysmal anxiety] without agoraphobia: Secondary | ICD-10-CM

## 2013-11-11 DIAGNOSIS — G4700 Insomnia, unspecified: Secondary | ICD-10-CM

## 2013-11-11 DIAGNOSIS — F419 Anxiety disorder, unspecified: Secondary | ICD-10-CM

## 2013-11-11 NOTE — Telephone Encounter (Signed)
Approved for # 30 + 0   Of   Lorazepam 1mg  one po QHS prn.

## 2013-11-11 NOTE — Telephone Encounter (Signed)
Patient needs refill on his lorazepam  Please call him back at  [phone]

## 2013-11-11 NOTE — Telephone Encounter (Signed)
Last RF 5/21 #30.  Last ROV 10/10/13  OK refill?

## 2013-11-12 MED ORDER — LORAZEPAM 1 MG PO TABS: 1.0000 mg | ORAL_TABLET | Freq: Every day | ORAL | Status: DC

## 2013-11-12 NOTE — Telephone Encounter (Signed)
RX called in.  Left patient message

## 2013-12-20 ENCOUNTER — Telehealth: Payer: Self-pay | Admitting: Family Medicine

## 2013-12-20 NOTE — Telephone Encounter (Signed)
RX called in.  Called patient and left message that appt will be needed for further refills.

## 2013-12-20 NOTE — Telephone Encounter (Signed)
Requesting refill Lorazepam 1 mg  HS prn.  Last RF 6/23 #30.  Last OV 6/12  OK refill?

## 2013-12-20 NOTE — Telephone Encounter (Signed)
Give 1 refill, needs appt before any further, to f/u his medications

## 2014-01-01 ENCOUNTER — Ambulatory Visit: Payer: 59 | Admitting: Physician Assistant

## 2014-01-09 ENCOUNTER — Ambulatory Visit: Payer: 59 | Admitting: Physician Assistant

## 2014-07-08 IMAGING — CR DG CHEST 2V
2 series · 2 of 2 positions shown · non-contrast
Comparison: DG RIBS UNILATERAL W/CHEST*L* dated 06/13/2012; DG
THORACIC SPINE W/SWIMMERS dated 11/03/2009

CLINICAL DATA: chest pain

EXAM:
CHEST  2 VIEW

[view not recorded (1 of 2)]
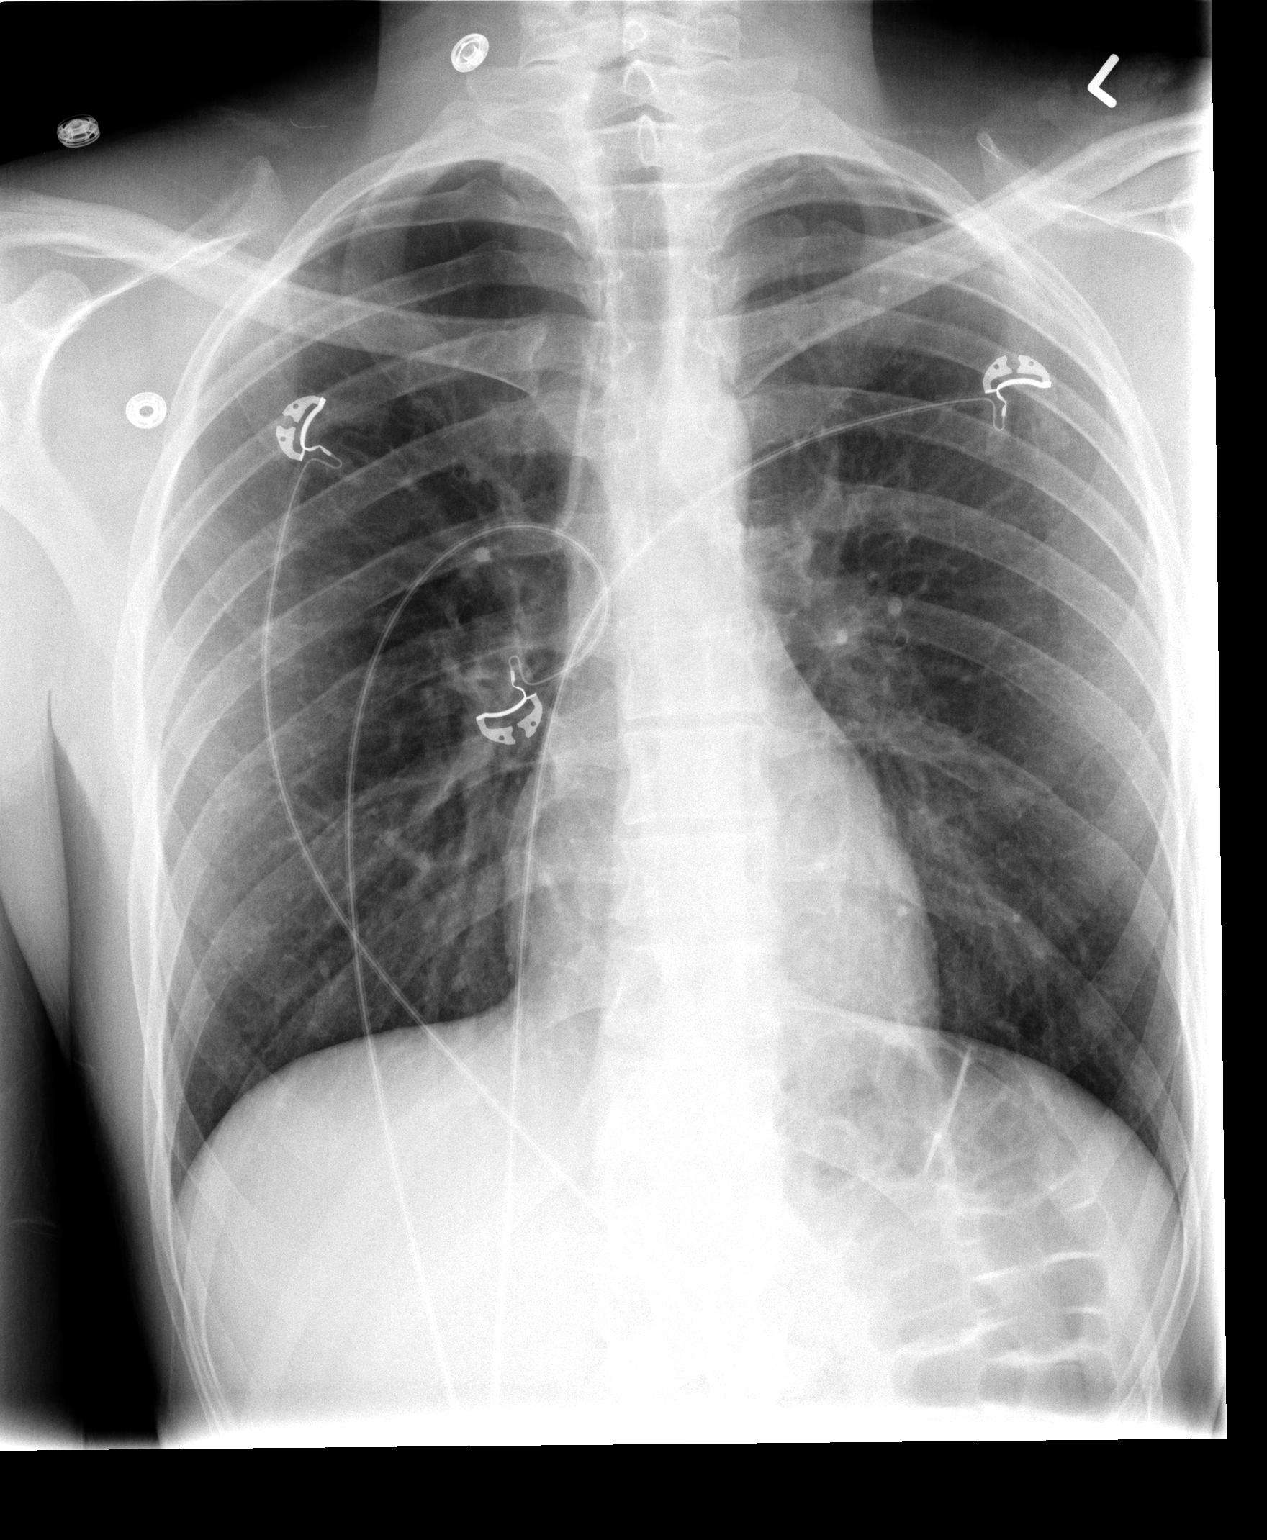

[view not recorded (2 of 2)]
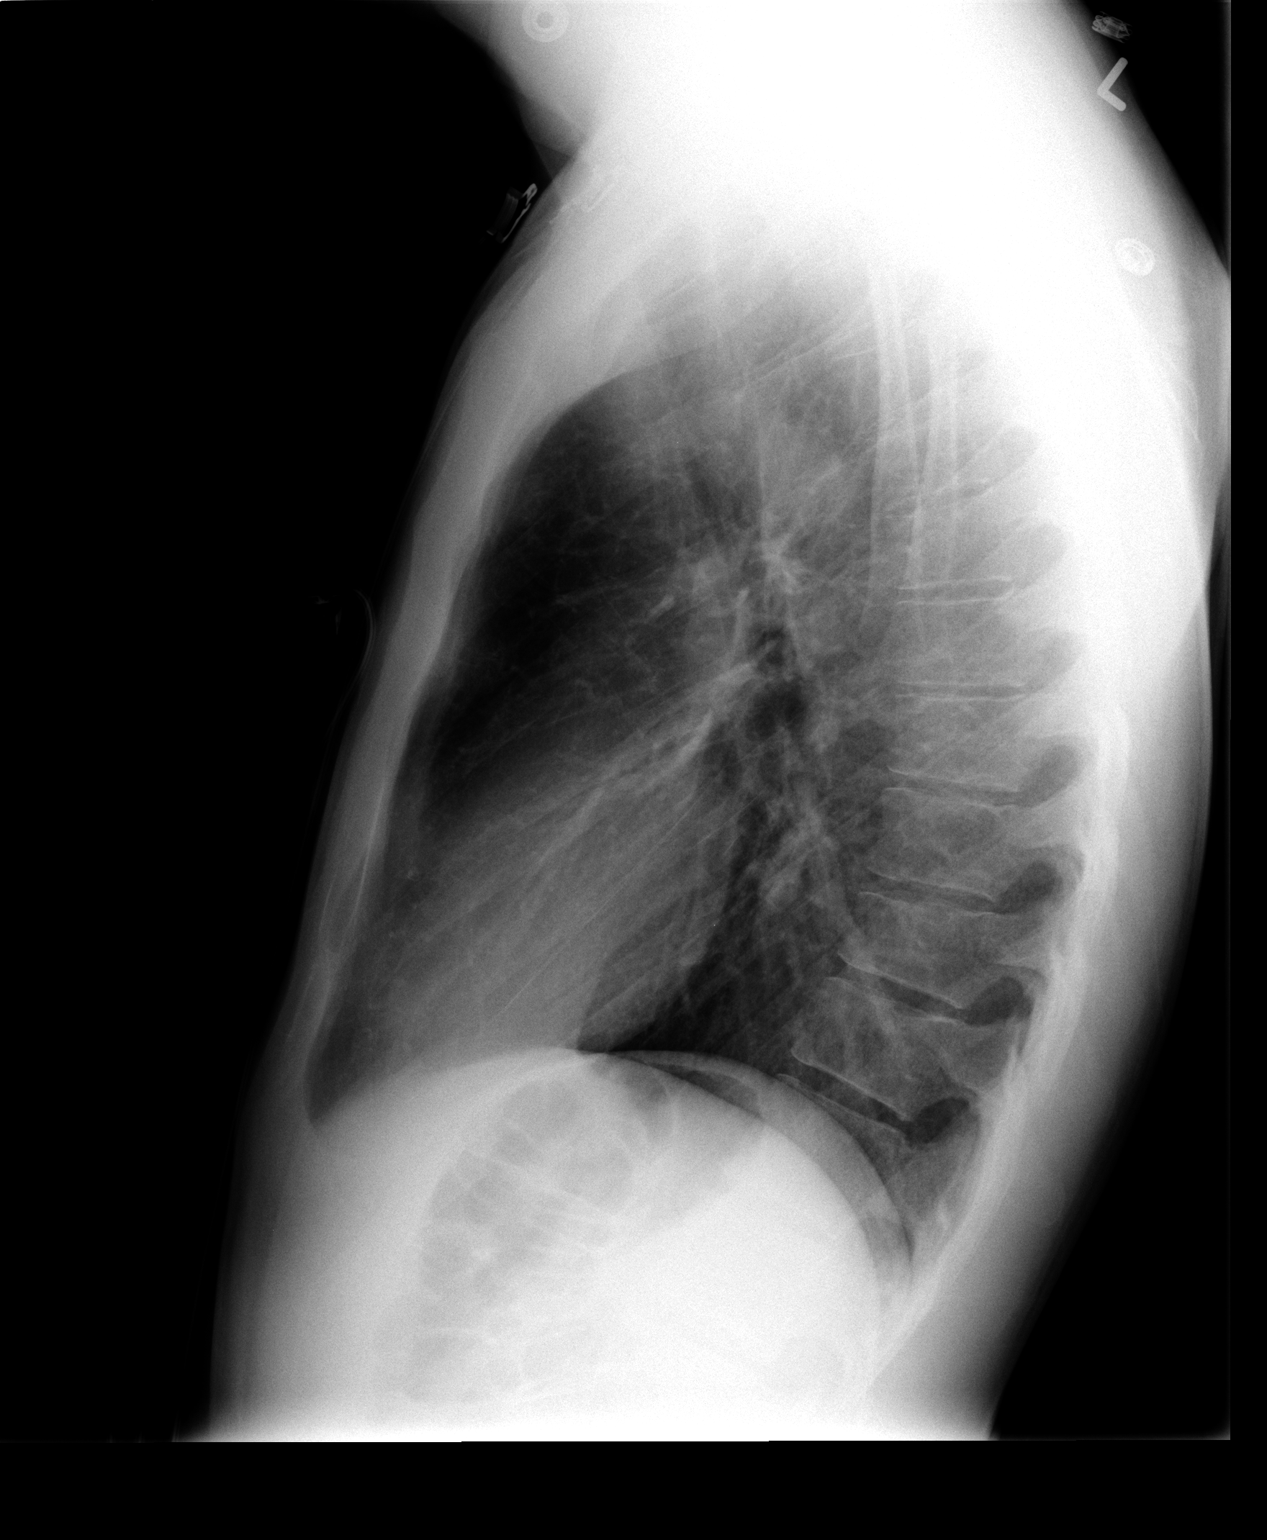

[2 of 2 positions shown; findings below may reference images not displayed]

FINDINGS: The heart size and mediastinal contours are within normal limits.
Both lungs are clear. The visualized skeletal structures are
unremarkable.
IMPRESSION: No active cardiopulmonary disease.

## 2014-07-08 IMAGING — CT CT HEAD W/O CM
1 of 2 series · 16 of 30 positions shown, 20 images · non-contrast
Comparison: CT HEAD W/O CM dated 09/20/2007

CLINICAL DATA: Seizure, weakness.

EXAM:
CT HEAD WITHOUT CONTRAST
TECHNIQUE: Contiguous axial images were obtained from the base of the skull
through the vertex without intravenous contrast.

[Series 3: headtrauma 2.4 h60s · axial · 0.43mm/px · z∈[+51,+209]mm · 16 of 72 slices shown, 20 images]
[im 4/72  brain]
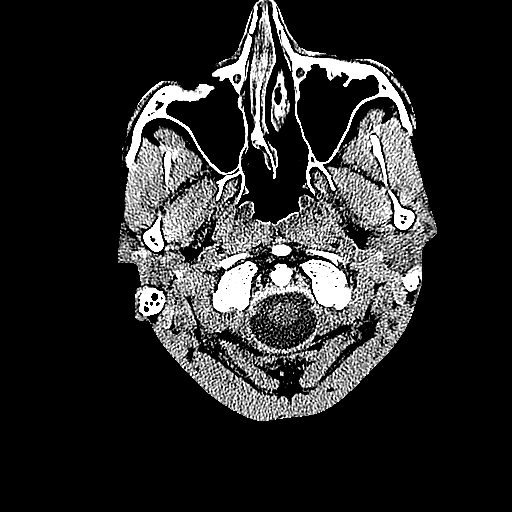
[im 4/72  bone]
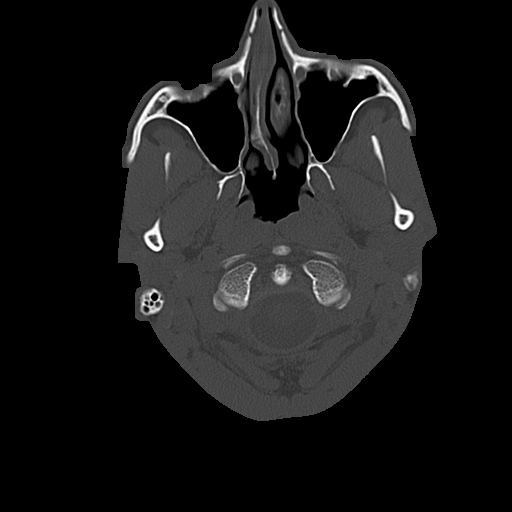
[im 8/72  brain]
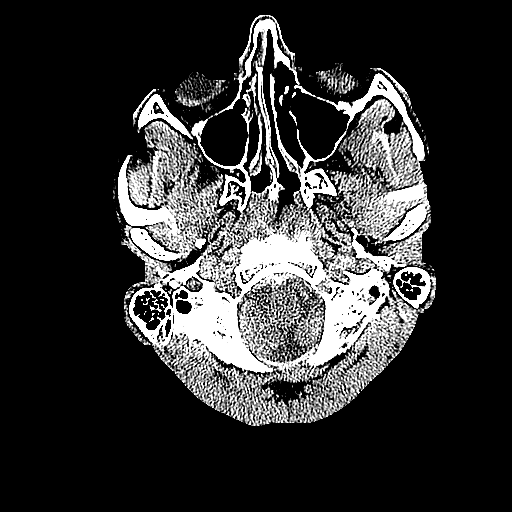
[im 12/72  brain]
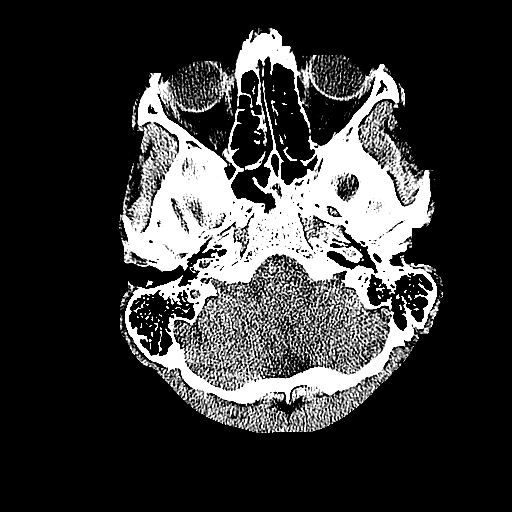
[im 15/72  brain]
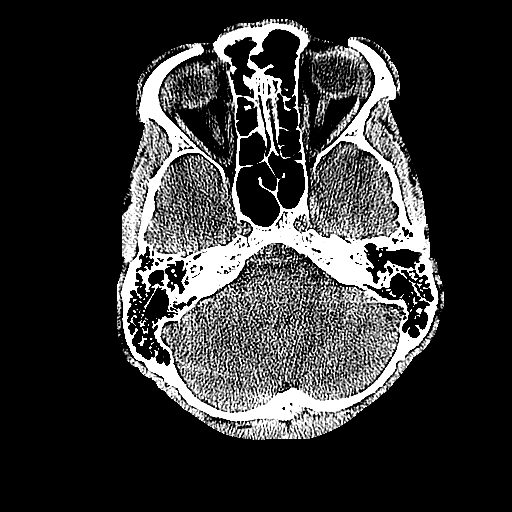
[im 23/72  brain]
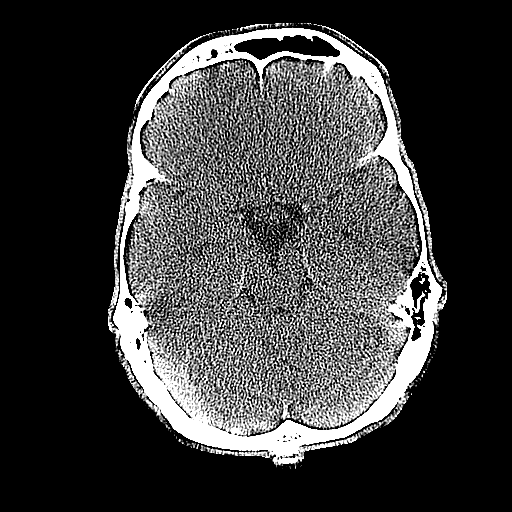
[im 23/72  bone]
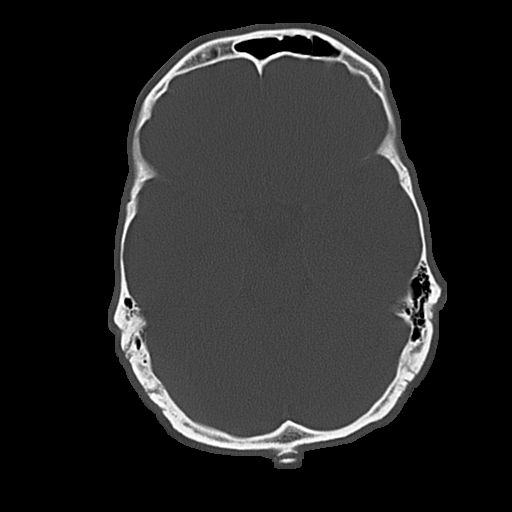
[im 27/72  brain]
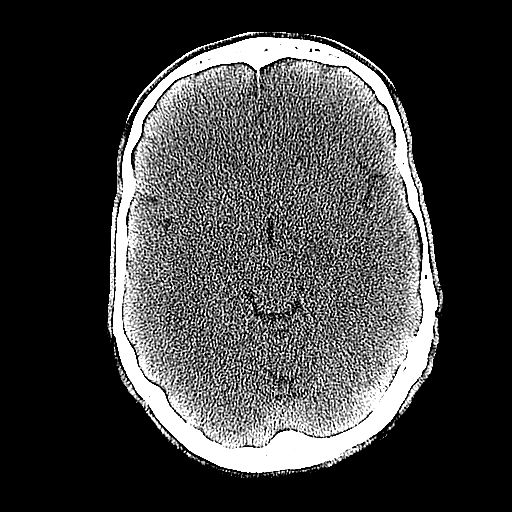
[im 30/72  brain]
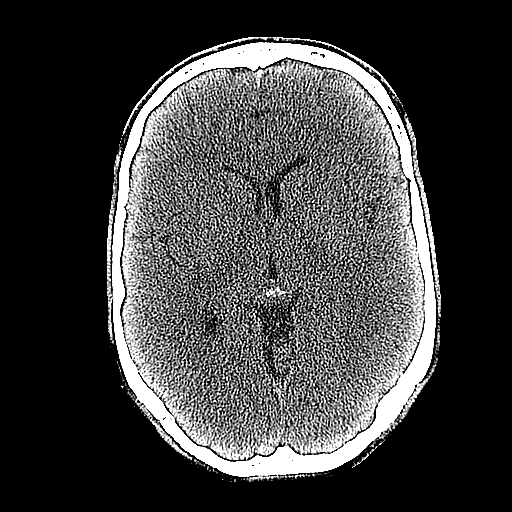
[im 34/72  brain]
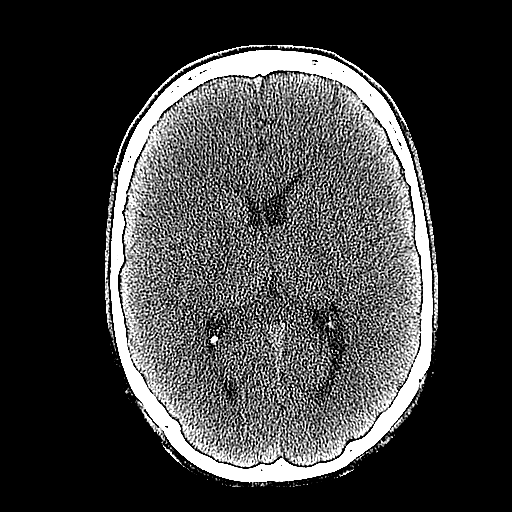
[im 38/72  brain]
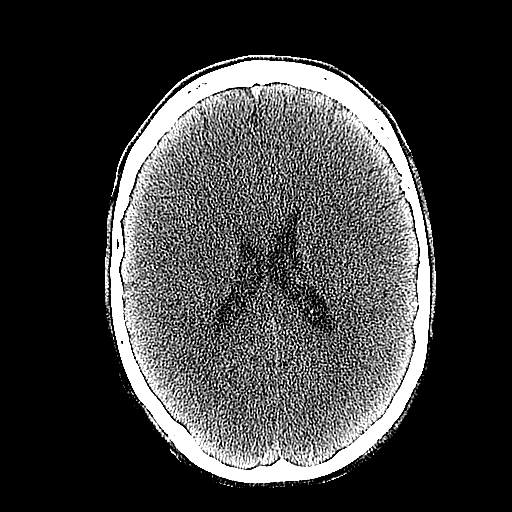
[im 38/72  bone]
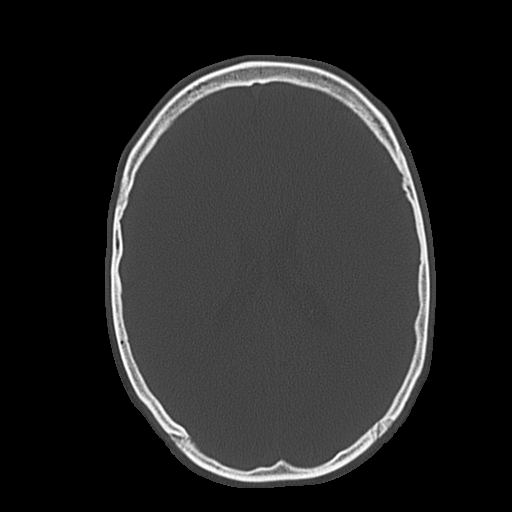
[im 42/72  brain]
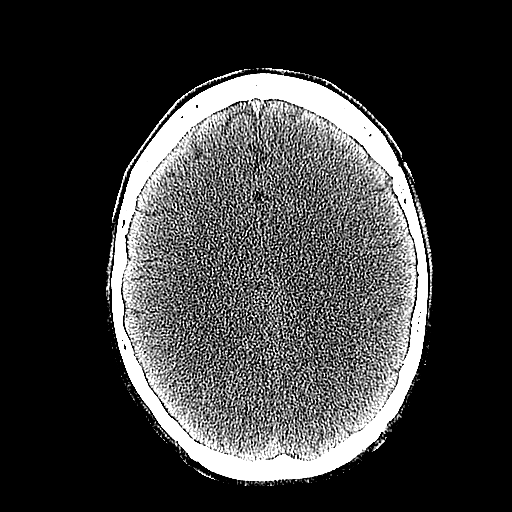
[im 45/72  brain]
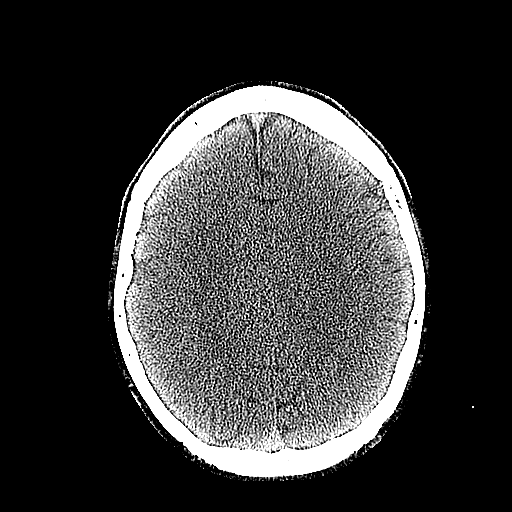
[im 49/72  brain]
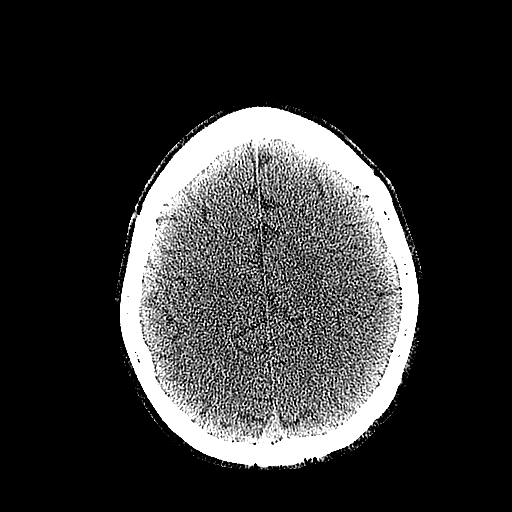
[im 57/72  brain]
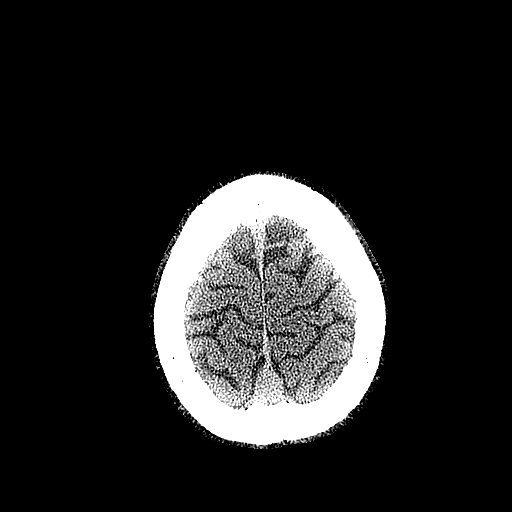
[im 57/72  bone]
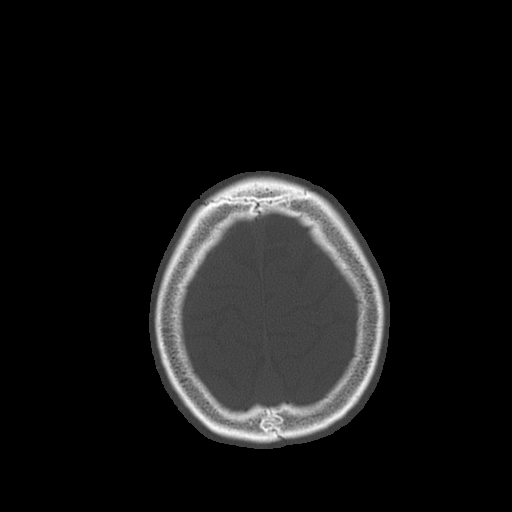
[im 60/72  brain]
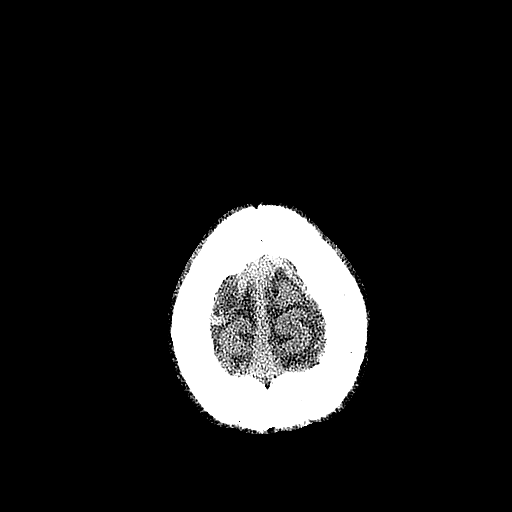
[im 64/72  brain]
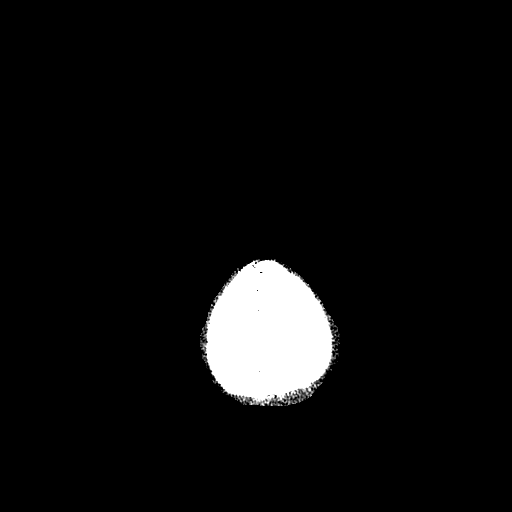
[im 68/72  brain]
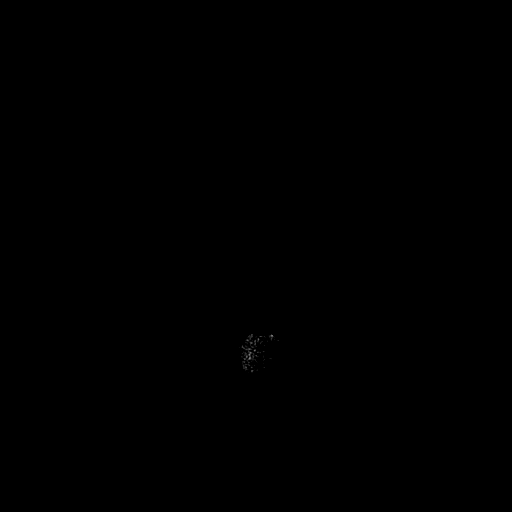

[16 of 30 positions shown; findings below may reference images not displayed]

FINDINGS: No acute intracranial abnormality. Specifically, no hemorrhage,
hydrocephalus, mass lesion, acute infarction, or significant
intracranial injury. No acute calvarial abnormality. Visualized
paranasal sinuses and mastoids clear. Orbital soft tissues
unremarkable.
IMPRESSION: No acute intracranial abnormality.

## 2014-12-22 ENCOUNTER — Emergency Department (HOSPITAL_COMMUNITY)
Admission: EM | Admit: 2014-12-22 | Discharge: 2014-12-23 | Disposition: A | Discharge status: Home / Self Care | Payer: 59 | Attending: Emergency Medicine | Admitting: Emergency Medicine

## 2014-12-22 ENCOUNTER — Encounter (HOSPITAL_COMMUNITY): Payer: Self-pay | Admitting: *Deleted

## 2014-12-22 DIAGNOSIS — F419 Anxiety disorder, unspecified: Secondary | ICD-10-CM | POA: Insufficient documentation

## 2014-12-22 DIAGNOSIS — Z720 Tobacco use: Secondary | ICD-10-CM | POA: Insufficient documentation

## 2014-12-22 DIAGNOSIS — Z87828 Personal history of other (healed) physical injury and trauma: Secondary | ICD-10-CM | POA: Insufficient documentation

## 2014-12-22 DIAGNOSIS — Z79899 Other long term (current) drug therapy: Secondary | ICD-10-CM | POA: Insufficient documentation

## 2014-12-22 NOTE — ED Notes (Signed)
Pt states that he has a hx of anxiety and normally takes Visceral. States that he was recently released from jail and ran out of his prescription and has been having anxiety x 3 weeks.

## 2014-12-22 NOTE — ED Notes (Signed)
Pt states that he drinks daily to calm himself down, states he drank 2 beers prior to coming to ED.

## 2014-12-23 MED ORDER — HYDROXYZINE HCL 25 MG PO TABS
25.0000 mg | ORAL_TABLET | Freq: Once | ORAL | Status: AC
  Administered 2014-12-23: 25 mg via ORAL
  Filled 2014-12-23: qty 1

## 2014-12-23 MED ORDER — HYDROXYZINE HCL 25 MG PO TABS: 25.0000 mg | ORAL_TABLET | Freq: Three times a day (TID) | ORAL | Status: DC | PRN

## 2014-12-23 NOTE — Discharge Instructions (Signed)
Panic Attacks °Panic attacks are sudden, short feelings of great fear or discomfort. You may have them for no reason when you are relaxed, when you are uneasy (anxious), or when you are sleeping.  °HOME CARE °· Take all your medicines as told. °· Check with your doctor before starting new medicines. °· Keep all doctor visits. °GET HELP IF: °· You are not able to take your medicines as told. °· Your symptoms do not get better. °· Your symptoms get worse. °GET HELP RIGHT AWAY IF: °· Your attacks seem different than your normal attacks. °· You have thoughts about hurting yourself or others. °· You take panic attack medicine and you have a side effect. °MAKE SURE YOU: °· Understand these instructions. °· Will watch your condition. °· Will get help right away if you are not doing well or get worse. °Document Released: 06/11/2010 Document Revised: 02/27/2013 Document Reviewed: 12/21/2012 °ExitCare® Patient Information ©2015 [employer]. This information is not intended to replace advice given to you by your health care provider. Make sure you discuss any questions you have with your health care provider. ° ° °Emergency Department Resource Guide °1) Find a Doctor and Pay Out of Pocket °Although you won't have to find out who is covered by your insurance plan, it is a good idea to ask around and get recommendations. You will then need to call the office and see if the doctor you have chosen will accept you as a new patient and what types of options they offer for patients who are self-pay. Some doctors offer discounts or will set up payment plans for their patients who do not have insurance, but you will need to ask so you aren't surprised when you get to your appointment. ° °2) Contact Your Local Health Department °Not all health departments have doctors that can see patients for sick visits, but many do, so it is worth a call to see if yours does. If you don't know where your local health department is, you can check in  your phone book. The CDC also has a tool to help you locate your state's health department, and many state websites also have listings of all of their local health departments. ° °3) Find a Walk-in Clinic °If your illness is not likely to be very severe or complicated, you may want to try a walk in clinic. These are popping up all over the country in pharmacies, drugstores, and shopping centers. They're usually staffed by nurse practitioners or physician assistants that have been trained to treat common illnesses and complaints. They're usually fairly quick and inexpensive. However, if you have serious medical issues or chronic medical problems, these are probably not your best option. ° °No Primary Care Doctor: °- Call [facility] at  832-8000 - they can help you locate a primary care doctor that  accepts your insurance, provides certain services, etc. °- Physician Referral Service- 1-800-533-3463 ° °Chronic Pain Problems: °Organization         Address  Phone   Notes  °[facility]  (336) 297-2271 Patients need to be referred by their primary care doctor.  ° °Medication Assistance: °Organization         Address  Phone   Notes  °[place] [facility] [address] °[place], [place] [address] (336) 641-8030 --Must be a resident of [place] °-- Must have NO insurance coverage whatsoever (no Medicaid/ Medicare, etc.) °-- The pt. MUST have a primary care doctor   that directs their care regularly and follows them in the community °  °[facility]  (866) 331-1348   °[employer]  (888) 892-1162   ° °Agencies that provide inexpensive medical care: °Organization         Address  Phone   Notes  °[facility]  (336) 832-8035   °[facility]    (336) 832-7272   °[facility] [address] °[place], [place] [address] (336) 832-4777   °[facility] [address], °[place] (336) 271-4999    °[facility]    (336) 373-0678   °[facility]    (336) 272-1050   °[facility] ° [address], [place] Phone:  (336) 832-4444, Fax:  (336) 832-4440 Hours of Operation:  9 am - 6 pm, M-F.  Also accepts Medicaid/Medicare and self-pay.  °[facility] ° [address], [place] Phone: (336) 832-3150, Fax: (336) [phone]. Hours of Operation:  8:30 am - 5:30 pm, M-F.  Also accepts Medicaid and self-pay.  °[facility] [place] [address], [place] Phone: (336) 878-6027   °[facility] [address], [place], [place] (336)723-1848, Ext. 123 Mondays & Thursdays: 7-9 AM.  First 15 patients are seen on a first come, first serve basis. °  ° °Medicaid-accepting [place] Providers: ° °Organization         Address  Phone   Notes  °[facility] [address], [place] (336) 641-2100 Also accepts self-pay patients.  °[facility] [address], [place] ° (336) 856-9996   °[facility] [address], [place] (336) 288-8857   °[facility] [address] [place] [address], [place] (336) 299-7000   °Veita Bland [address], [place]  ° (336) 373-1557 Only accepts Hand Access Medicaid patients after they have their name applied to their card.  ° °Self-Pay (no insurance) in [place]: ° °Organization         Address  Phone   Notes  °Sickle Cell Patients, [facility] [address], [place] (336) 832-1970   °[facility] [address], [place] (336) 832-4400   °[facility] ° [address], [place] (336) 992-4800   °[facility]/Dr. Osei-Bonsu ° [address] [place] [address], [place] or [address], [place] (336) 841-8500 Phone number for both [place] and [place] locations is the  same.  °[facility] [address], [place] (336) 299-0000   °[facility] [address] [place] [address], [place] or [address] (336) 852-7530 °(336) 878-2260   °[facility] [address], [place] (336) 350-1642, phone; (336) 294-5005, fax Sees patients 1st and 3rd Saturday of every month.  Must not qualify for public or private insurance (i.e. Medicaid, Medicare, [place] Health Choice, Veterans' Benefits) • Household income should be no more than 200% of the poverty level •The clinic cannot treat you if you are pregnant or think you are pregnant • Sexually transmitted diseases are not treated at the clinic.  ° ° °Dental Care: °Organization         Address  Phone  Notes  °[facility] [address], [place] (336) 641-6152 Accepts children   up to age 21 who are enrolled in Medicaid or [place] Health Choice; pregnant women with a Medicaid card; and children who have applied for Medicaid or [place] Health Choice, but were declined, whose parents can pay a reduced fee at time of service.  °[facility] [place]  [address], [place] (336) 641-7733 Accepts children up to age 21 who are enrolled in Medicaid or [place] Health Choice; pregnant women with a Medicaid card; and children who have applied for Medicaid or [place] Health Choice, but were declined, whose parents can pay a reduced fee at time of service.  °[facility] ° [address], [place] (336) 641-4533 Patients are seen by appointment only. Walk-ins are not accepted. [facility] will see patients 18 years of age and older. °Monday - Tuesday (8am-5pm) °Most Wednesdays (8:30-5pm) °$30 per visit, cash only  °[facility] ° [address], [place] (336) 641-4533 Patients are seen by appointment only. Walk-ins are not accepted. [facility] will see patients  18 years of age and older. °One Wednesday Evening (Monthly: Volunteer Based).  $30 per visit, cash only  °[facility]  (919) 537-3737 for adults; Children under age 4, call Graduate Pediatric Dentistry at (919) [phone]. Children aged 4-14, please call (919) [phone] to request a pediatric application. ° Dental services are provided in all areas of dental care including fillings, crowns and bridges, complete and partial dentures, implants, gum treatment, root canals, and extractions. Preventive care is also provided. Treatment is provided to both adults and children. °Patients are selected via a lottery and there is often a waiting list. °  °[facility] [address], °[place] ° (336) 763-8833 [email or web address] °  °[facility] [address], [place], [place] (336)723-1848, Ext. 123 Second and Fourth Thursday of each month, opens at 6:30 AM; Clinic ends at 9 AM.  Patients are seen on a first-come first-served basis, and a limited number are seen during each clinic.  ° °[facility] ° [address], [place], [place] (336) 723-7904   Eligibility Requirements °You must have lived in [place], [place], or [place] counties for at least the last three months. °  You cannot be eligible for state or federal sponsored healthcare insurance, including Veterans Administration, Medicaid, or Medicare. °  You generally cannot be eligible for healthcare insurance through your employer.  °  How to apply: °Eligibility screenings are held every Tuesday and Wednesday afternoon from 1:00 pm until 4:00 pm. You do not need an appointment for the interview!  °[facility] [address], [place], [place] 336-631-2330   °[facility]  336-342-8273   °[facility]  336-703-3100   ° [facility]  336-570-6415   ° °Behavioral Health Resources in the Community: °Intensive Outpatient  Programs °Organization         Address  Phone  Notes  °[facility] [address], [place], [place] 336-878-6098   °[facility] [address], [place], [place] 336-832-9800   °[facility] [address], [place], [place] ° 336-882-2125   °[facility] [address],  °,  1-800-853-5163 or 336-641-4981   °Substance Abuse Resources °Organization         Address  Phone  Notes  °[facility]  336-882-2125   °[facility]  336-784-9470   °[facility]  336-285-9073   °[facility]  336-845-3988   °Residential & Outpatient Substance Abuse Program  1-800-659-3381   °Psychological Services °Organization         Address  Phone  Notes  °[facility]  336- 832-9600   °[facility]  336- 378-7881   °[facility] [address], [place] 1-800-853-5163 or 336-641-4981   ° °Mobile Crisis Teams °Organization         Address  Phone  Notes  °[facility], Mobile Crisis Care Unit  1-877-626-1772   °Assertive °[facility] ° [address] [place], [place] 336-834-9664   °Sharon DeEsch [address] °[place] 336-554-5454   ° °Self-Help/Support Groups °Organization         Address  Phone             Notes  °[facility] - variety of support groups  336- 373-1402 Call for more information  °Narcotics Anonymous (NA), [facility] [address], °[place]  2 meetings at this location  ° °Residential Treatment Programs °Organization         Address  Phone  Notes  °[facility] [address],    °[place]  1-866-801-8205   °[facility] ° [address], [place], [place] 704-293-8524   °[facility] [address], [place] 336-845-3988 Admissions: 8am-3pm M-F  °[facility] [address]    °[place], [place]  336-841-1104   °[facility] [address], [place], [place] 336-379-7146   °[facility] [address]  °[place], [place] 336-285-9073   °[facility] Intensive Outpatient [address], [place], [place] 336-852-3033   °[facility] [address]  °[place], [place] 1-877-615-2722 or 336-784-9470   °[facility] [address] [place], [place] 336-227-7417 Accepts Medicaid  °[facility] [address]  °[place] 1-800-659-3381 Substance Abuse/Addiction Treatment  ° °[place] Behavioral Health Resources °Organization         Address  Phone  Notes  °[facility]  (888) 581-9988   °Julie Brannon, PhD [address] [place], [place]   (336) 349-5553 or (336) 951-0000   °[facility]   [address] °[place], [place] (336) 349-4454   °[facility] [address], [place], [place] (336) 342-8316 Insurance/Medicaid/sponsorship through [facility]  °[facility] [address]                                    [place], [place] (336) 342-8316 Therapy/tele-psych/case  °[facility] [address]  ° [place], [place] (336) 349-2233    °Dr. Arfeen  (336) 349-4544   °[facility] [place]  [employer] [place] [facility] 1) [address], [place] °2) [address], [place] °3)  [address], [place] (336) 349-3220 °(336) 342-7768 ° °(336) 342-8140   °[place] [facility] (336) 342-1394 or (336) 342-3537 (After Hours)    ° ° ° °
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
## 2015-04-11 ENCOUNTER — Emergency Department (HOSPITAL_COMMUNITY)
Admission: EM | Admit: 2015-04-11 | Discharge: 2015-04-11 | Disposition: A | Discharge status: Home / Self Care | Payer: 59 | Attending: Emergency Medicine | Admitting: Emergency Medicine

## 2015-04-11 ENCOUNTER — Encounter (HOSPITAL_COMMUNITY): Payer: Self-pay | Admitting: Emergency Medicine

## 2015-04-11 DIAGNOSIS — Z760 Encounter for issue of repeat prescription: Secondary | ICD-10-CM | POA: Insufficient documentation

## 2015-04-11 DIAGNOSIS — F419 Anxiety disorder, unspecified: Secondary | ICD-10-CM | POA: Insufficient documentation

## 2015-04-11 DIAGNOSIS — Z79899 Other long term (current) drug therapy: Secondary | ICD-10-CM | POA: Insufficient documentation

## 2015-04-11 DIAGNOSIS — F17210 Nicotine dependence, cigarettes, uncomplicated: Secondary | ICD-10-CM | POA: Insufficient documentation

## 2015-04-11 MED ORDER — FLUOXETINE HCL 20 MG PO TABS: 20.0000 mg | ORAL_TABLET | Freq: Every day | ORAL | Status: DC

## 2015-04-11 MED ORDER — HYDROXYZINE HCL 25 MG PO TABS: 25.0000 mg | ORAL_TABLET | ORAL | Status: DC | PRN

## 2015-04-11 NOTE — ED Notes (Signed)
Pt sts he is out of his meds and needs a refill, has Dr appt in Jan, NAD

## 2015-04-11 NOTE — ED Notes (Signed)
Patient undressed, in gown, on continuous pulse oximetry and blood pressure cuff; warm blankets given 

## 2015-04-11 NOTE — Discharge Instructions (Signed)
Medicine Refill at the Emergency Department Follow-up with her primary care doctor for future refills. This is not done routinely in the emergency department. We have refilled your medicine today, but it is best for you to get refills through your primary health care provider's office. In the future, please plan ahead so you do not need to get refills from the emergency department. If the medicine we refilled was a maintenance medicine, you may have received only enough to get you by until you are able to see your regular health care provider.   This information is not intended to replace advice given to you by your health care provider. Make sure you discuss any questions you have with your health care provider.   Document Released: 08/26/2003 Document Revised: 05/30/2014 Document Reviewed: 08/16/2013 Elsevier Interactive Patient Education [employer]2016 Elsevier Inc.
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
## 2015-06-16 ENCOUNTER — Emergency Department (HOSPITAL_COMMUNITY)
Admission: EM | Admit: 2015-06-16 | Discharge: 2015-06-16 | Discharge status: Against Medical Advice | Payer: 59 | Attending: Emergency Medicine | Admitting: Emergency Medicine

## 2015-06-16 ENCOUNTER — Encounter (HOSPITAL_COMMUNITY): Payer: Self-pay | Admitting: *Deleted

## 2015-06-16 ENCOUNTER — Other Ambulatory Visit: Payer: Self-pay

## 2015-06-16 DIAGNOSIS — Z9119 Patient's noncompliance with other medical treatment and regimen: Secondary | ICD-10-CM | POA: Diagnosis not present

## 2015-06-16 DIAGNOSIS — X58XXXA Exposure to other specified factors, initial encounter: Secondary | ICD-10-CM | POA: Insufficient documentation

## 2015-06-16 DIAGNOSIS — F17210 Nicotine dependence, cigarettes, uncomplicated: Secondary | ICD-10-CM | POA: Insufficient documentation

## 2015-06-16 DIAGNOSIS — R569 Unspecified convulsions: Secondary | ICD-10-CM | POA: Diagnosis not present

## 2015-06-16 DIAGNOSIS — F419 Anxiety disorder, unspecified: Secondary | ICD-10-CM | POA: Insufficient documentation

## 2015-06-16 DIAGNOSIS — S00512A Abrasion of oral cavity, initial encounter: Secondary | ICD-10-CM | POA: Diagnosis not present

## 2015-06-16 DIAGNOSIS — Y998 Other external cause status: Secondary | ICD-10-CM | POA: Diagnosis not present

## 2015-06-16 DIAGNOSIS — Y9389 Activity, other specified: Secondary | ICD-10-CM | POA: Insufficient documentation

## 2015-06-16 DIAGNOSIS — Y92002 Bathroom of unspecified non-institutional (private) residence single-family (private) house as the place of occurrence of the external cause: Secondary | ICD-10-CM | POA: Insufficient documentation

## 2015-06-16 NOTE — ED Notes (Signed)
Per EMS pt refused to have IV started with them.

## 2015-06-16 NOTE — ED Notes (Signed)
Pt refused to have blood drawn. Went in & spoke to pt at length about the need for blood work as a basic starting point for testing. Pt saying he feels ok & thinks everything will be ok. Answered several questions for his wife. After conversation pt states he still is going to leave. Pt signed AMA paperwork & ambulated out of the department w/ no complications. Pt was instructed not to drive until he is seen by a neurologist.

## 2015-06-16 NOTE — ED Notes (Addendum)
Patient placed in gown, on cardiac monitor and EKG done and given to EDP. Seizure pads in place at this time.

## 2015-06-16 NOTE — ED Notes (Signed)
Pt brought in by [facility] for c/o seizure; wife told ems that she heard something in the shower and when she went to check on him, pt was found in bathtub; ems reports pt was post-ictal; pt abuses ETOH and is trying to stop; pt is alert upon arrival to ED
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
## 2015-06-17 ENCOUNTER — Ambulatory Visit: Payer: 59 | Admitting: Physician Assistant

## 2015-06-18 ENCOUNTER — Ambulatory Visit: Payer: 59 | Admitting: Physician Assistant

## 2015-06-22 ENCOUNTER — Ambulatory Visit: Payer: 59 | Admitting: Physician Assistant

## 2015-08-20 ENCOUNTER — Encounter: Payer: Self-pay | Admitting: Physician Assistant

## 2015-08-20 ENCOUNTER — Ambulatory Visit (INDEPENDENT_AMBULATORY_CARE_PROVIDER_SITE_OTHER): Payer: 59 | Admitting: Physician Assistant

## 2015-08-20 VITALS — BP 124/86 | HR 84 | Temp 98.2°F | Resp 18 | Wt 150.0 lb

## 2015-08-20 DIAGNOSIS — F410 Panic disorder [episodic paroxysmal anxiety] without agoraphobia: Secondary | ICD-10-CM

## 2015-08-20 DIAGNOSIS — F419 Anxiety disorder, unspecified: Secondary | ICD-10-CM

## 2015-08-20 DIAGNOSIS — G4700 Insomnia, unspecified: Secondary | ICD-10-CM

## 2015-08-20 MED ORDER — HYDROXYZINE HCL 25 MG PO TABS: ORAL_TABLET | ORAL | Status: DC

## 2015-08-20 MED ORDER — LORAZEPAM 1 MG PO TABS: 1.0000 mg | ORAL_TABLET | Freq: Every day | ORAL | Status: DC

## 2015-08-20 MED FILL — hydrOXYzine HCL 25 MG TABS: 25 | 30 days supply | Qty: 90 | Fill #0

## 2015-08-20 NOTE — Progress Notes (Signed)
Patient ID: Tanner Bennett MRN: [id number], DOB: 02/24/1986, 30 y.o. Date of Encounter: 08/20/2015, 3:02 PM    Chief Complaint:  Chief Complaint  Patient presents with  . wants to discuss panic attacks     HPI: 30 y.o. year old male here with above.   I reviewed his office visit notes with me 07/2013 through 09/2013. Patient states that he has not seen me in a while because he was in jail for one year. Says something about that he had gotten in trouble long time ago and was told he could either do 10 years probation and pay $1800 a month or go to go to jail. Says that he decided to go to jail for a year. Says that he was in jail 01/01/14 through 08/31/14. Says he just got his insurance back so came here for visit. Says that [place] was arrested here [place]. Says that they gave him Atarax/Vistaril 75 mg every morning while he was in the jail. Says if he had a bad panic attacks and they would give him an Ativan PRN.     Home Meds:   Outpatient Prescriptions Prior to Visit  Medication Sig Dispense Refill  . hydrOXYzine (ATARAX/VISTARIL) 25 MG tablet Take 1 tablet (25 mg total) by mouth as needed. 20 tablet 0  . LORazepam (ATIVAN) 1 MG tablet Take 1 tablet (1 mg total) by mouth at bedtime. 30 tablet 0   No facility-administered medications prior to visit.    Allergies: No Known Allergies    Review of Systems: See HPI for pertinent ROS. All other ROS negative.    Physical Exam: Blood pressure 124/86, pulse 84, temperature 98.2 F (36.8 C), temperature source Oral, resp. rate 18, weight 150 lb (68.04 kg)., Body mass index is 19.79 kg/(m^2). General: WNWD WM. Hair long dreadlocks.  Appears in no acute distress. Neck: Supple. No thyromegaly. No lymphadenopathy. Lungs: Clear bilaterally to auscultation without wheezes, rales, or rhonchi. Breathing is unlabored. Heart: Regular rhythm. No murmurs, rubs, or gallops. Msk:  Strength and tone normal for age. Extremities/Skin:  Warm and dry.  Neuro: Alert and oriented X 3. Moves all extremities spontaneously. Gait is normal. CNII-XII grossly in tact. Psych:  Responds to questions appropriately with a normal affect. Normal, appropriate mood and affect.      ASSESSMENT AND PLAN:  30 y.o. year old male with  1. Anxiety I told him I would give him medicine to use for short-term but then she needs to follow-up with psychiatry. Told him that he can check with his insurance to find out which psychiatry offices are covered with his insurance and otherwise he can call and schedule this himself and does not need referral from us. He voices understanding and agrees to schedule follow-up with psychiatry. - hydrOXYzine (ATARAX/VISTARIL) 25 MG tablet; Take 3 every morning.  Dispense: 90 tablet; Refill: 0 - LORazepam (ATIVAN) 1 MG tablet; Take 1 tablet (1 mg total) by mouth at bedtime.  Dispense: 30 tablet; Refill: 0  2. Panic disorder I told him I would give him medicine to use for short-term but then she needs to follow-up with psychiatry. Told him that he can check with his insurance to find out which psychiatry offices are covered with his insurance and otherwise he can call and schedule this himself and does not need referral from us. He voices understanding and agrees to schedule follow-up with psychiatry.  - hydrOXYzine (ATARAX/VISTARIL) 25 MG tablet; Take 3 every morning.  Dispense: 90 tablet; Refill: 0 - LORazepam (ATIVAN) 1 MG tablet; Take 1 tablet (1 mg total) by mouth at bedtime.  Dispense: 30 tablet; Refill: 0  3. Insomnia I told him I would give him medicine to use for short-term but then she needs to follow-up with psychiatry. Told him that he can check with his insurance to find out which psychiatry offices are covered with his insurance and otherwise he can call and schedule this himself and does not need referral from [place]. He voices understanding and agrees to schedule follow-up with psychiatry.  -  hydrOXYzine (ATARAX/VISTARIL) 25 MG tablet; Take 3 every morning.  Dispense: 90 tablet; Refill: 0 - LORazepam (ATIVAN) 1 MG tablet; Take 1 tablet (1 mg total) by mouth at bedtime.  Dispense: 30 tablet; Refill: 0   Signed, [address] [place], [place], [facility] 08/20/2015 3:02 PM

## 2015-08-21 MED FILL — LORazepam 1 MG TABS: 1 | 30 days supply | Qty: 30 | Fill #0

## 2015-09-18 ENCOUNTER — Telehealth: Payer: Self-pay | Admitting: Physician Assistant

## 2015-09-22 ENCOUNTER — Other Ambulatory Visit: Payer: Self-pay | Admitting: Family Medicine

## 2015-09-22 DIAGNOSIS — F419 Anxiety disorder, unspecified: Secondary | ICD-10-CM

## 2015-09-22 DIAGNOSIS — F410 Panic disorder [episodic paroxysmal anxiety] without agoraphobia: Secondary | ICD-10-CM

## 2015-09-22 DIAGNOSIS — G4700 Insomnia, unspecified: Secondary | ICD-10-CM

## 2015-09-22 NOTE — Telephone Encounter (Signed)
Duplicate message. 

## 2015-09-22 NOTE — Telephone Encounter (Signed)
Pt needs a refill of Lorazepam 1 mg and Hydroxyzine 25mg 

## 2015-09-22 NOTE — Telephone Encounter (Signed)
Ok refill? 

## 2015-09-23 ENCOUNTER — Other Ambulatory Visit: Payer: Self-pay | Admitting: Family Medicine

## 2015-09-23 NOTE — Telephone Encounter (Signed)
Had to find someone who takes his insurance.  Have found one but hasn't been seen yet.(appt about 2 week out)  Needs refills?

## 2015-09-23 NOTE — Telephone Encounter (Signed)
error 

## 2015-09-23 NOTE — Telephone Encounter (Signed)
Reviewed my Ov note 08/20/15. Told pt I would Rx meds short term but for him to schedule appt with psychiatry and f/u with psychiatry. Needs to be seeing Psych and getting meds from Psych.

## 2015-09-23 NOTE — Telephone Encounter (Signed)
Will fill for one month supply but then no further refills.  Must keep appt with Psychiatrist and f/u with them for further Rx May fill: Hydroyxine # 90 + 0 Ativan # 30 + 0

## 2015-09-24 MED ORDER — LORAZEPAM 1 MG PO TABS: 1.0000 mg | ORAL_TABLET | Freq: Every day | ORAL | Status: AC

## 2015-09-24 MED ORDER — HYDROXYZINE HCL 25 MG PO TABS: ORAL_TABLET | ORAL | Status: AC

## 2015-09-24 MED FILL — hydrOXYzine HCL 25 MG TABS: 25 | 30 days supply | Qty: 90 | Fill #0

## 2015-09-24 MED FILL — LORazepam 1 MG TABS: 1 | 30 days supply | Qty: 30 | Fill #0

## 2015-09-24 NOTE — Telephone Encounter (Signed)
1 refill each faxed to pharmacy.  Left message for pt to call back

## 2016-03-04 ENCOUNTER — Encounter: Payer: Self-pay | Admitting: Physician Assistant

## 2016-08-24 ENCOUNTER — Encounter: Payer: Self-pay | Admitting: Physician Assistant

## 2016-09-20 ENCOUNTER — Encounter: Payer: Self-pay | Admitting: Physician Assistant

## 2016-09-22 ENCOUNTER — Encounter: Payer: Self-pay | Admitting: Physician Assistant

## 2017-08-05 ENCOUNTER — Emergency Department (HOSPITAL_COMMUNITY): Admission: EM | Admit: 2017-08-05 | Discharge: 2017-08-05 | Discharge status: Absent without leave | Payer: 59
